# Patient Record
Sex: Female | Born: 1950
Health system: Southern US, Community
[De-identification: ages and names within clinical notes are randomized; demographics above are authoritative.]

## PROBLEM LIST (undated history)

## (undated) DIAGNOSIS — E785 Hyperlipidemia, unspecified: Secondary | ICD-10-CM

## (undated) DIAGNOSIS — H269 Unspecified cataract: Secondary | ICD-10-CM

## (undated) DIAGNOSIS — N809 Endometriosis, unspecified: Secondary | ICD-10-CM

## (undated) DIAGNOSIS — C50919 Malignant neoplasm of unspecified site of unspecified female breast: Secondary | ICD-10-CM

## (undated) DIAGNOSIS — G4700 Insomnia, unspecified: Secondary | ICD-10-CM

## (undated) DIAGNOSIS — M810 Age-related osteoporosis without current pathological fracture: Secondary | ICD-10-CM

## (undated) DIAGNOSIS — E041 Nontoxic single thyroid nodule: Secondary | ICD-10-CM

## (undated) DIAGNOSIS — E119 Type 2 diabetes mellitus without complications: Secondary | ICD-10-CM

## (undated) DIAGNOSIS — B351 Tinea unguium: Secondary | ICD-10-CM

## (undated) HISTORY — DX: Malignant neoplasm of unspecified site of unspecified female breast: C50.919

## (undated) HISTORY — DX: Nontoxic single thyroid nodule: E04.1

## (undated) HISTORY — DX: Unspecified cataract: H26.9

## (undated) HISTORY — DX: Endometriosis, unspecified: N80.9

## (undated) HISTORY — PX: OOPHORECTOMY: SHX86

## (undated) HISTORY — PX: BREAST SURGERY: SHX581

## (undated) HISTORY — DX: Insomnia, unspecified: G47.00

## (undated) HISTORY — DX: Type 2 diabetes mellitus without complications: E11.9

## (undated) HISTORY — DX: Hyperlipidemia, unspecified: E78.5

## (undated) HISTORY — DX: Age-related osteoporosis without current pathological fracture: M81.0

## (undated) HISTORY — PX: APPENDECTOMY: SHX54

## (undated) HISTORY — DX: Tinea unguium: B35.1

## (undated) HISTORY — PX: VAGINAL HYSTERECTOMY: SUR661

---

## 1997-11-21 ENCOUNTER — Other Ambulatory Visit: Admission: RE | Admit: 1997-11-21 | Discharge: 1997-11-21 | Payer: Self-pay | Admitting: Obstetrics and Gynecology

## 1998-12-04 ENCOUNTER — Ambulatory Visit (HOSPITAL_COMMUNITY): Admission: RE | Admit: 1998-12-04 | Discharge: 1998-12-04 | Payer: Self-pay | Admitting: Family Medicine

## 1998-12-11 ENCOUNTER — Other Ambulatory Visit: Admission: RE | Admit: 1998-12-11 | Discharge: 1998-12-11 | Payer: Self-pay | Admitting: Obstetrics and Gynecology

## 1999-12-17 ENCOUNTER — Other Ambulatory Visit: Admission: RE | Admit: 1999-12-17 | Discharge: 1999-12-17 | Payer: Self-pay | Admitting: Obstetrics and Gynecology

## 2000-03-23 ENCOUNTER — Ambulatory Visit (HOSPITAL_COMMUNITY): Admission: RE | Admit: 2000-03-23 | Discharge: 2000-03-23 | Payer: Self-pay | Admitting: Obstetrics and Gynecology

## 2000-03-23 ENCOUNTER — Encounter (INDEPENDENT_AMBULATORY_CARE_PROVIDER_SITE_OTHER): Payer: Self-pay

## 2000-11-15 ENCOUNTER — Encounter: Payer: Self-pay | Admitting: Family Medicine

## 2000-11-15 ENCOUNTER — Ambulatory Visit (HOSPITAL_COMMUNITY): Admission: RE | Admit: 2000-11-15 | Discharge: 2000-11-15 | Payer: Self-pay | Admitting: *Deleted

## 2001-01-01 ENCOUNTER — Other Ambulatory Visit: Admission: RE | Admit: 2001-01-01 | Discharge: 2001-01-01 | Payer: Self-pay | Admitting: Obstetrics and Gynecology

## 2001-05-31 ENCOUNTER — Inpatient Hospital Stay (HOSPITAL_COMMUNITY): Admission: RE | Admit: 2001-05-31 | Discharge: 2001-06-02 | Payer: Self-pay | Admitting: Obstetrics and Gynecology

## 2001-05-31 ENCOUNTER — Encounter (INDEPENDENT_AMBULATORY_CARE_PROVIDER_SITE_OTHER): Payer: Self-pay | Admitting: Specialist

## 2002-11-22 ENCOUNTER — Ambulatory Visit (HOSPITAL_COMMUNITY): Admission: RE | Admit: 2002-11-22 | Discharge: 2002-11-22 | Payer: Self-pay | Admitting: Pediatrics

## 2003-02-14 ENCOUNTER — Ambulatory Visit (HOSPITAL_COMMUNITY): Admission: RE | Admit: 2003-02-14 | Discharge: 2003-02-14 | Payer: Self-pay | Admitting: *Deleted

## 2004-11-05 ENCOUNTER — Ambulatory Visit (HOSPITAL_COMMUNITY): Admission: RE | Admit: 2004-11-05 | Discharge: 2004-11-05 | Payer: Self-pay | Admitting: Pediatrics

## 2004-12-03 ENCOUNTER — Encounter: Admission: RE | Admit: 2004-12-03 | Discharge: 2004-12-03 | Payer: Self-pay | Admitting: Obstetrics and Gynecology

## 2005-01-17 ENCOUNTER — Ambulatory Visit (HOSPITAL_BASED_OUTPATIENT_CLINIC_OR_DEPARTMENT_OTHER): Admission: RE | Admit: 2005-01-17 | Discharge: 2005-01-17 | Payer: Self-pay | Admitting: Surgery

## 2005-01-17 ENCOUNTER — Encounter (INDEPENDENT_AMBULATORY_CARE_PROVIDER_SITE_OTHER): Payer: Self-pay | Admitting: Surgery

## 2005-01-17 ENCOUNTER — Ambulatory Visit (HOSPITAL_COMMUNITY): Admission: RE | Admit: 2005-01-17 | Discharge: 2005-01-17 | Payer: Self-pay | Admitting: Surgery

## 2005-01-17 ENCOUNTER — Encounter (INDEPENDENT_AMBULATORY_CARE_PROVIDER_SITE_OTHER): Payer: Self-pay | Admitting: Specialist

## 2005-01-27 ENCOUNTER — Encounter (INDEPENDENT_AMBULATORY_CARE_PROVIDER_SITE_OTHER): Payer: Self-pay | Admitting: *Deleted

## 2005-01-27 ENCOUNTER — Ambulatory Visit (HOSPITAL_BASED_OUTPATIENT_CLINIC_OR_DEPARTMENT_OTHER): Admission: RE | Admit: 2005-01-27 | Discharge: 2005-01-27 | Payer: Self-pay | Admitting: Surgery

## 2005-01-27 ENCOUNTER — Ambulatory Visit (HOSPITAL_COMMUNITY): Admission: RE | Admit: 2005-01-27 | Discharge: 2005-01-27 | Payer: Self-pay | Admitting: Surgery

## 2005-01-31 ENCOUNTER — Ambulatory Visit: Payer: Self-pay | Admitting: Oncology

## 2005-02-02 ENCOUNTER — Ambulatory Visit: Admission: RE | Admit: 2005-02-02 | Discharge: 2005-03-02 | Payer: Self-pay | Admitting: Radiation Oncology

## 2005-03-23 ENCOUNTER — Ambulatory Visit (HOSPITAL_COMMUNITY): Admission: RE | Admit: 2005-03-23 | Discharge: 2005-03-23 | Payer: Self-pay | Admitting: Surgery

## 2005-03-23 ENCOUNTER — Ambulatory Visit (HOSPITAL_BASED_OUTPATIENT_CLINIC_OR_DEPARTMENT_OTHER): Admission: RE | Admit: 2005-03-23 | Discharge: 2005-03-24 | Payer: Self-pay | Admitting: Surgery

## 2005-03-23 ENCOUNTER — Encounter (INDEPENDENT_AMBULATORY_CARE_PROVIDER_SITE_OTHER): Payer: Self-pay | Admitting: Specialist

## 2006-05-15 IMAGING — US US SOFT TISSUE HEAD/NECK
1 series · 14 of 25 positions shown · non-contrast
Comparison: Prior study 11/22/02.

CLINICAL DATA: Follow-up thyroid nodule. 
 THYROID ULTRASOUND:

[Series 1: unknown · 0.09mm/px · 14 of 38 slices shown]
[im 1/38]
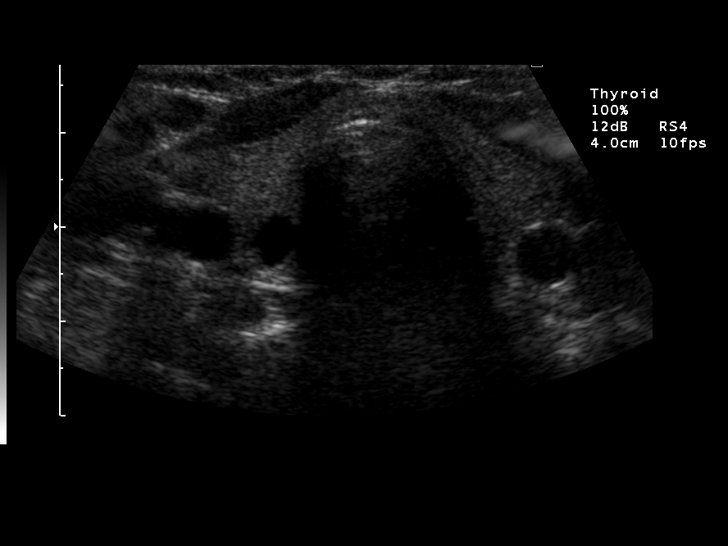
[im 4/38]
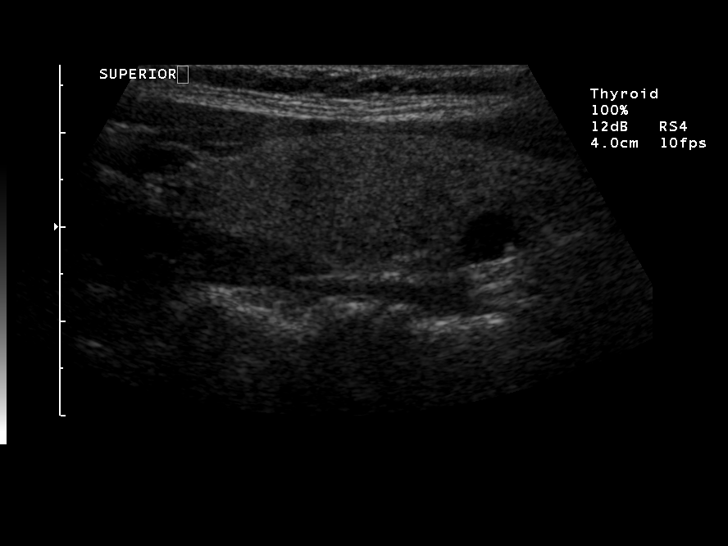
[im 7/38]
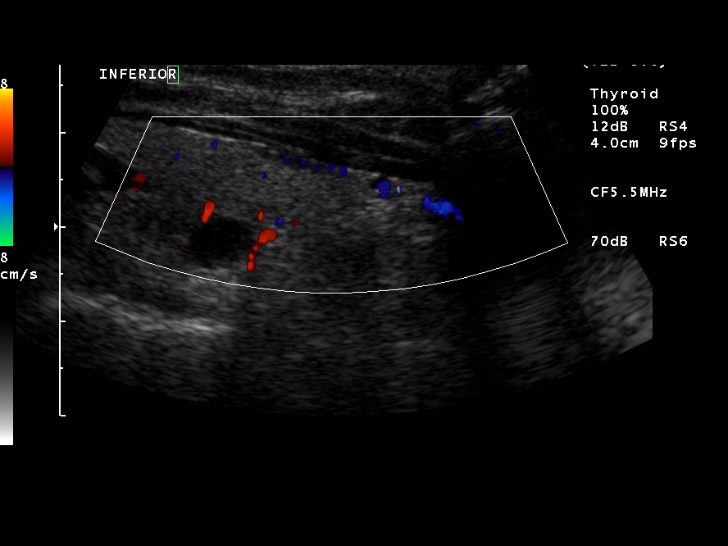
[im 10/38]
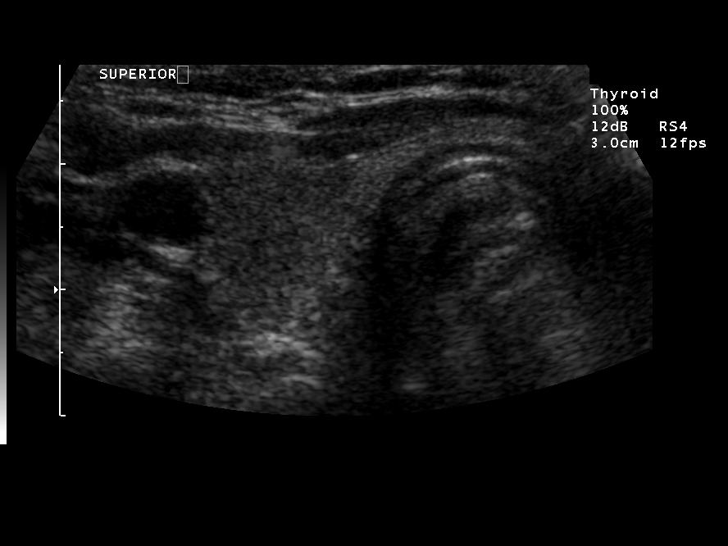
[im 13/38]
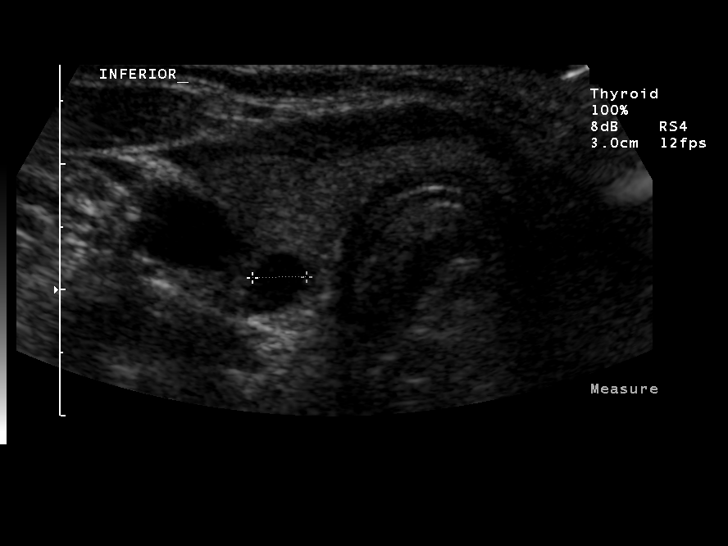
[im 14/38]
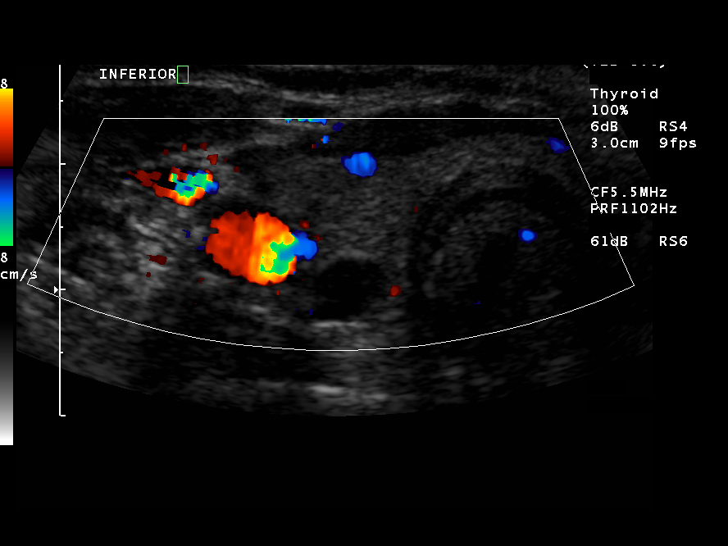
[im 17/38]
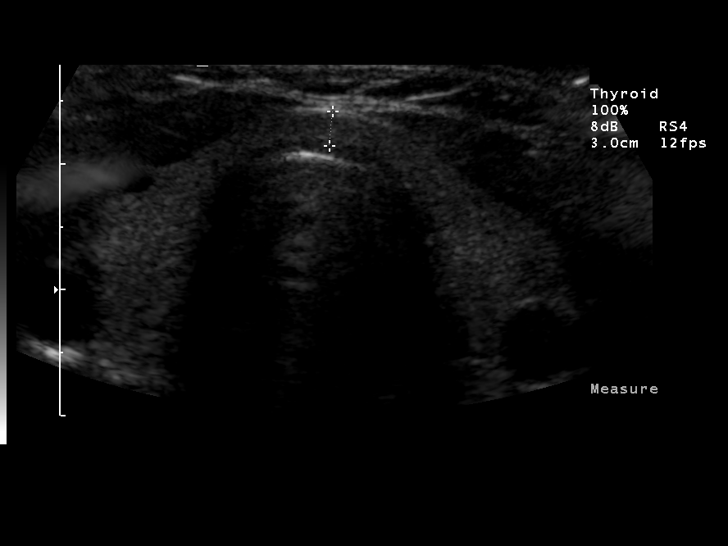
[im 21/38]
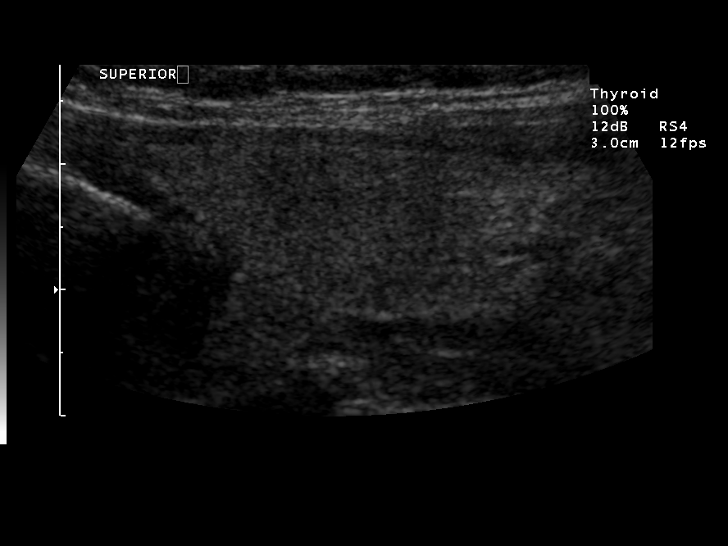
[im 24/38]
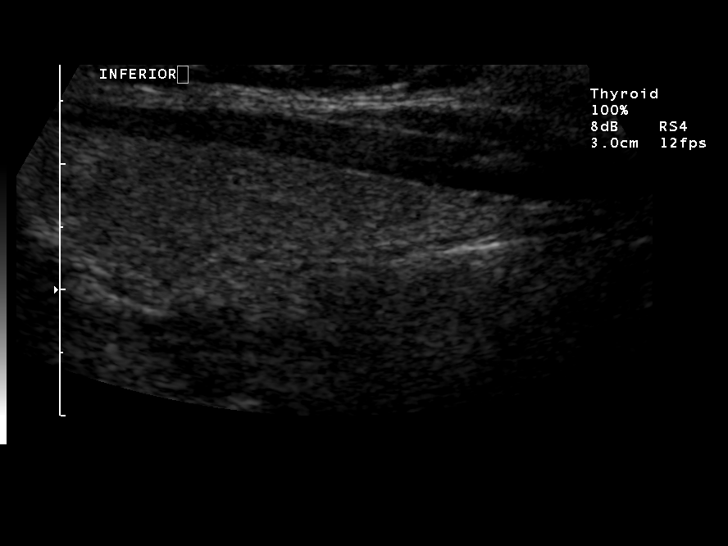
[im 25/38]
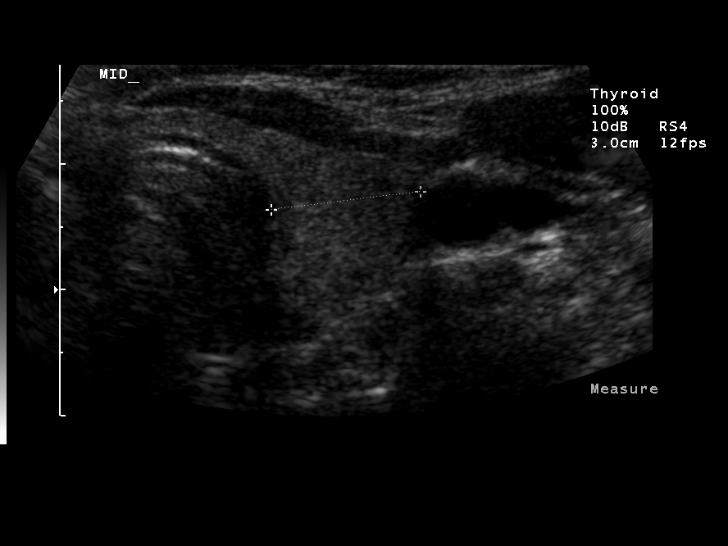
[im 28/38]
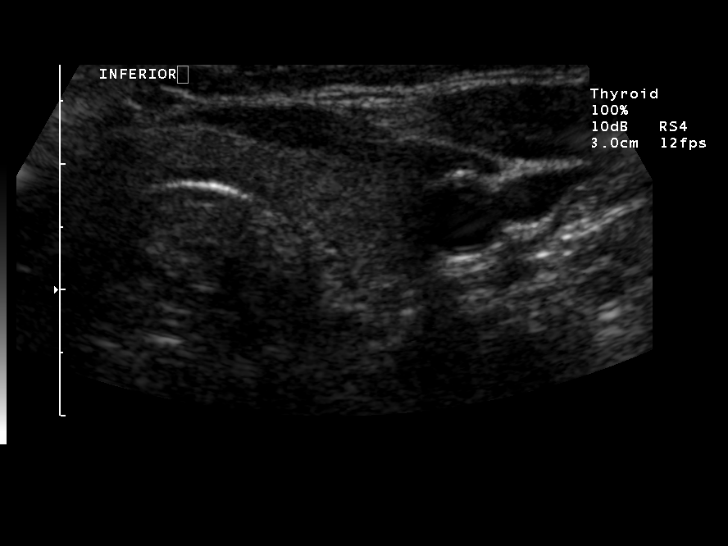
[im 31/38]
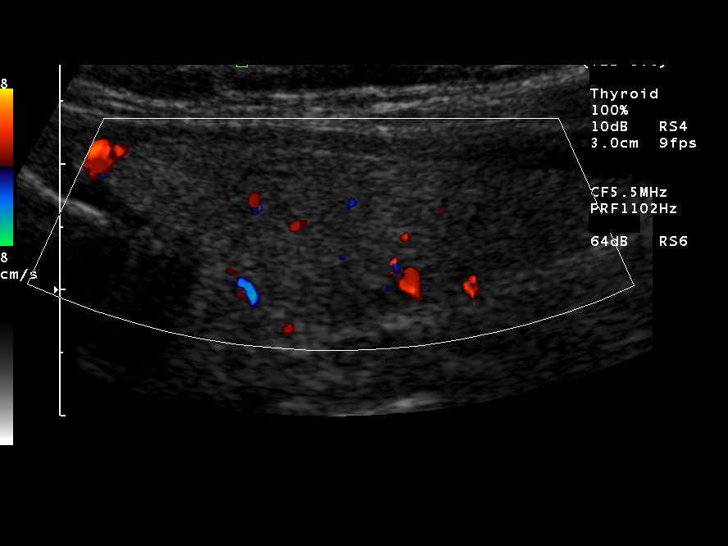
[im 34/38]
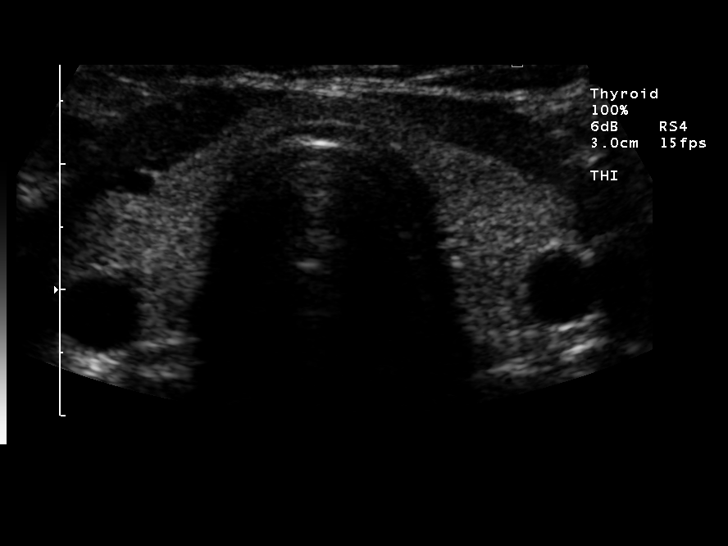
[im 38/38]
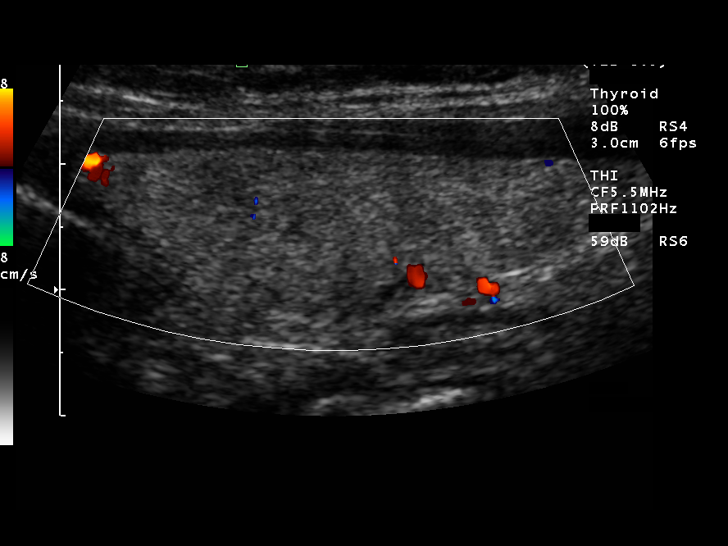

[14 of 25 positions shown; findings below may reference images not displayed]

The right lobe of the thyroid measures 4.3 X 1.5 X 1.4 cm and the left lobe 4.9 X 1.3 X 1.2 cm.  Posteriorly in the lower portion of the right lobe of the thyroid, there is a cyst that measures 8 X 5 X 4 mm.  There is a small echogenic area along the posterior-inferior margin of this cyst, but it otherwise appears simple and shows no abnormal blood flow.  This is only minimally larger than on the prior study on which it had a maximal diameter of 6 mm.  No solid thyroid nodules are demonstrated.
IMPRESSION: Little change is seen from the patient?s prior study.  A small cystic lesion inferiorly in the right lobe of the thyroid has a nonaggressive appearance and is probably a colloid cyst.

## 2006-10-13 ENCOUNTER — Ambulatory Visit (HOSPITAL_COMMUNITY): Admission: RE | Admit: 2006-10-13 | Discharge: 2006-10-13 | Payer: Self-pay

## 2008-02-07 ENCOUNTER — Ambulatory Visit (HOSPITAL_COMMUNITY): Admission: RE | Admit: 2008-02-07 | Discharge: 2008-02-07 | Payer: Self-pay | Admitting: *Deleted

## 2009-10-08 ENCOUNTER — Ambulatory Visit (HOSPITAL_COMMUNITY): Admission: RE | Admit: 2009-10-08 | Discharge: 2009-10-08 | Payer: Self-pay

## 2010-12-07 NOTE — Op Note (Signed)
[name], [name]              ACCOUNT NO.:  [id number]   MEDICAL RECORD NO.:  [id number]          PATIENT TYPE:  AMB   LOCATION:  ENDO                         FACILITY:  [facility]   PHYSICIAN:  Georgiana Spinner, M.D.    DATE OF BIRTH:  [birth year]   DATE OF PROCEDURE:  DATE OF DISCHARGE:                               OPERATIVE REPORT   PROCEDURE:  Colonoscopy.   INDICATIONS:  Colon cancer screening.   ANESTHESIA:  Fentanyl 100 mcg, Versed 10 mg.   PROCEDURE IN DETAIL:  With the patient mildly sedated, in the left  lateral decubitus position, the Pentax videoscopic pediatric colonoscope  was inserted in the rectum and passed under direct vision to the cecum,  identified by ileocecal valve and appendiceal orifice, both of which  were photographed.  From this point the colonoscope was slowly  withdrawn, taking circumferential views of colonic mucosa, stopping only  in the rectum which appeared normal on rectum and showed small  hemorrhoids on retroflexed view.  The endoscope was straightened and  withdrawn.  The patient's vital signs, pulse oximeter remained stable.  The patient tolerated procedure well without apparent complications.   FINDINGS:  Small internal hemorrhoids, otherwise an unremarkable exam.   PLAN:  Consider repeat examination in 5-10 years.           ______________________________  Georgiana Spinner, M.D.     GMO/MEDQ  D:  02/07/2008  T:  02/07/2008  Job:  [id number]   cc:   Konrad Felix  [address] [place]  [place] [address]   Malachi Pro. Ambrose Mantle, M.D.  Fax: [phone]

## 2010-12-10 NOTE — Op Note (Signed)
   NAME:  Christina, Hart                     ACCOUNT NO.:  [id number]   MEDICAL RECORD NO.:  [id number]                   PATIENT TYPE:  AMB   LOCATION:  ENDO                                 FACILITY:  [facility]   PHYSICIAN:  Georgiana Spinner, M.D.                 DATE OF BIRTH:  [birth year]   DATE OF PROCEDURE:  02/14/2003  DATE OF DISCHARGE:                                 OPERATIVE REPORT   PROCEDURE:  Colonoscopy.   INDICATIONS:  Colon cancer screening.   ANESTHESIA:  Demerol 80 mg, Versed 8.   DESCRIPTION OF PROCEDURE:  With the patient mildly sedated and in the left  lateral decubitus position, the Olympus videoscopic variable-stiffness  colonoscope was inserted in the rectum and passed under direct vision.  With  pressure applied to the abdomen, we reached the cecum, identified by crow's  foot of the cecum and the ileocecal valve and transillumination of the right  lower quadrant of the abdomen.  From this point the colonoscope was slowly  withdrawn, taking circumferential views of the entire colonic mucosa,  stopping only in the rectum which appeared normal on direct and retroflexed  view.  The endoscope was straightened and withdrawn.  The patient's vital  signs and pulse oximetry remained stable.  The patient tolerated the  procedure well without apparent complications.   FINDINGS:  This is a negative colonoscopic examination to the cecum.   PLAN:  Consider repeat examination possibly in 5-10 years.                                               Georgiana Spinner, M.D.    GMO/MEDQ  D:  02/14/2003  T:  02/14/2003  Job:  [id number]   cc:   Demetrios Loll. Ambrose Mantle, M.D.  [address]  [address]  [place]  [place] [address]  Fax: [phone]

## 2010-12-10 NOTE — H&P (Signed)
[facility]  Patient:    [name], [name] Visit Number: [id number] MRN: [id number]          Service Type: GYN Location: 4W 0477 01 Attending Physician:  Malon Kindle Dictated by:   Malachi Pro. Ambrose Mantle, M.D. Admit Date:  05/31/2001                           History and Physical  HISTORY OF PRESENT ILLNESS:  This is a 60 year old, white, married female, para 1-0-0-1, who was admitted to the hospital for abdominal hysterectomy and removal of her remaining ovary and both tubes because of recurrent and persistent abnormal uterine bleeding, leiomyomata uteri, and probable adenomyosis.  Last menstrual period May 20, 2001.  The patient has kept a log of her menstrual flow over the last year.  During the month of October, she had some bleeding all but three days.  During the month of September, she bled for over 10 days.  During the month of August, she had bleeding for at least 15 days.  She has been plaqued with abnormal uterine bleeding for some time.  In August of 2001, she underwent a D&C and hysteroscopy which did not show an intraluminal fibroids, but showed polyps and the tissue was benign. She has been treated over the last year with Provera on a monthly basis and in spite of that has continued to have abnormal uterine bleeding.  In the past, she had undergone oophorectomy for endometriosis of the right ovary and she subsequently had an enlarged uterus that was thought to be secondary to either fibroid alone or adenomyosis and fibroid.  At the present time, she is admitted to remove the uterus and the remaining tubes and ovary.  ALLERGIES:  No known drug allergies.  PAST SURGICAL HISTORY:  She did have a right oophorectomy in 1982.  She had a cesarean section in 1987.  She had removal of an endometrioma of the abdominal wall in 1989 and a D&C and hysteroscopy in 2001.  PAST MEDICAL HISTORY:  Illnesses:  She has had no major adult  illness.  SOCIAL HISTORY:  She does not drink or smoke.  REVIEW OF SYSTEMS:  She has a negative review of systems, except as in the present illness.  MEDICATIONS:  She takes Synthroid, Provera, and Achromycin.  FAMILY HISTORY:  Her mother died at 52 of cancer.  One sister is living and well.  One brother is living and well.  Her father died at 93 or 50 of heart disease.  PHYSICAL EXAMINATION:  A well-developed, well-nourished, white female in no acute distress.  VITAL SIGNS:  The blood pressure is 130/80 and the pulse is 80.  WEIGHT:  121 pounds.  HEENT:  No cranial abnormalities.  Extraocular movements are intact.  The nose and pharynx are clear.  NECK:  Supple without thyromegaly.  I do not truly feel a thyroid nodule at this time, although she has had a thyroid nodule in the past and has been suppressed with Synthroid.  BREASTS:  Soft without masses.  HEART:  Normal size and sounds.  No murmurs.  LUNGS:  Clear to P&A.  ABDOMEN:  Soft, flat, and nontender.  There is a healed lower abdominal transverse scar.  PELVIC:  The vulva and vagina are clear.  A Pap smear on January 01, 2001, was within normal limits.  The cervix is clean.  The uterus is anterior and upper limit of normal size.  The adnexa are free of masses.  RECTAL:  Exam in June was negative.  ADMITTING IMPRESSION: 1. Persistent abnormal uterine bleeding in spite of dilatation and curettage,    hysteroscopy, and multiple attempts at control with Provera. 2. Leiomyomata uteri. 3. Probable adenomyosis.  PLAN:  The patient is admitted for abdominal hysterectomy, removal of the left ovary, and both tubes.  She does understand that the risks of surgery include, but are not limited to heart attack, stroke, pulmonary embolus, wound disruption, thrombophlebitis, hemorrhage with need for reoperation and/or transfusion, fistula formation, nerve injury, and intestinal obstruction.  She understands and agrees to  proceed.  She has also been counseled about the indeterminate effect of hysterectomy and oophorectomy on sexual desire and function.  She understands and agrees to proceed with surgery. Dictated by:   Malachi Pro. Ambrose Mantle, M.D. Attending Physician:  Malon Kindle DD:  05/30/01 TD:  06/01/01 Job: [id number]

## 2010-12-10 NOTE — Discharge Summary (Signed)
[facility]  Patient:    [name], [name] Visit Number: [id number] MRN: [id number]          Service Type: GYN Location: 4W 0477 01 Attending Physician:  Malon Kindle Dictated by:   Malachi Pro. Ambrose Mantle, M.D. Admit Date:  05/31/2001 Discharge Date: 06/02/2001                             Discharge Summary  HISTORY OF PRESENT ILLNESS:  A 60 year old white female with persistent abnormal uterine bleeding, fibroids, probable adenomyosis, admitted for abdominal hysterectomy, left salpingo-oophorectomy, and right salpingectomy. At the time of surgery, the patient was found to have left ovarian endometriosis binding the left ovary to the pelvic wall, endometriosis binding the rectum to the posterior cervix, endometriosis of the broad ligaments, and a left paraovarian cyst.  HOSPITAL COURSE:  She underwent the lysis of adhesions, abdominal hysterectomy, left salpingo-oophorectomy, and right salpingectomy by Dr. Ambrose Mantle with Dr. Jackelyn Knife assisting under general anesthesia with blood loss about 350 cc.  Postoperatively, the patient did quite well, temperature maximum of 100.3 degrees.  Abdomen was soft and nontender.  She passed flatus spontaneously.  Incision looked like it was healing well.  She was ambulating well without difficulty, voiding well and, on the second postoperative day, was felt to be a candidate for discharge.  Urinalysis was negative. 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  CBC showed a white count of 5600, hemoglobin 14.2, hematocrit 40.7 with 59 segs, 30 lymphs, 7 monos, 3 eosinophils, and 1 basophil.  Follow-up hematocrit 35.8 and a subsequent hematocrit was stable.  Pathology report is not available.  FINAL DIAGNOSIS;  Leiomyomata uteri, probable adenomyosis, persistent abnormal uterine bleeding despite a dilation and curettage and repeated bouts  of hormonal therapy.  OPERATION:  Abdominal hysterectomy, left salpingo-oophorectomy, right salpingectomy, lysis of adhesions.  FINAL CONDITION:  Improved.  INSTRUCTIONS:  Regular discharge instructions.  No vaginal entrance.  No heavy lifting or strenuous activity.  Concentrate on a liquid diet until freely passing flatus and having bowel movements.  Report any vaginal bleeding of any significance.  Report any temperature elevation greater than 100.6 degrees. Percocet 5/325, #16 tablets, one or two every three to four hours as needed for pain.  Return to the office in three days for staple removal and follow-up. Dictated by:   Malachi Pro. Ambrose Mantle, M.D. Attending Physician:  Malon Kindle DD:  06/02/01 TD:  06/03/01 Job: [id number]

## 2010-12-10 NOTE — Op Note (Signed)
[name], [name]              ACCOUNT NO.:  [id number]   MEDICAL RECORD NO.:  [id number]          PATIENT TYPE:  AMB   LOCATION:  DSC                          FACILITY:  [facility]   PHYSICIAN:  Currie Paris, M.D.DATE OF BIRTH:  07/26/1950   DATE OF PROCEDURE:  03/23/2005  DATE OF DISCHARGE:                                 OPERATIVE REPORT   OFFICE MEDICAL RECORD NUMBER:  [id number].   PREOPERATIVE DIAGNOSIS:  DCIS a left breast with close margin on lumpectomy.   POSTOPERATIVE DIAGNOSIS:  DCIS a left breast with close margin on  lumpectomy.   OPERATION:  Left total mastectomy with blue dye injection and axillary  sentinel lymph node biopsy (one node).   SURGEON:  Currie Paris, M.D.   ASSISTANT:  Rose Phi. Maple Hudson, M.D.   ANESTHESIA:  General.   CLINICAL HISTORY:  [name] is a 60 year old lady who had an extensive DCIS  process involving the central area of her breast. She underwent a partial  mastectomy to include the nipple-areolar complex and her cranial margin was  very close. After lengthy discussion with the patient and consultations with  radiation therapist and other surgeons, she elected to proceed to a total  mastectomy and sentinel node biopsy. She at this point did not wish to  proceed to a reconstruction.   DESCRIPTION OF PROCEDURE:  The patient was seen in the holding are and she  had no further questions. Her left breast had already been injected by the  radiology department and marked by the patient. I confirmed the left breast  was the operative side and marked it as well.   The patient was taken to the operating room and after satisfactory general  anesthesia had been obtained, the left breast was prepped with some alcohol.  The time-out occurred. I injected about 5 mL of methylene blue subdermally.   The breast was then prepped and draped as a sterile field. I outlined an  elliptical incision. The patient had a hemangiomatous type of lesion  in the  lower inner quadrant that she wished to have removed along with this that we  were able to include in the incision. I then marked the axilla and the area  that was hot so we would have that identified.   The elliptical incision was then made and a skin flap raised medially to the  middle of the sternum and then superiorly to the clavicle and laterally out  into the axilla. Once we had the skin elevated over the area that was hot I  was able to divide into the axillary fat pad and almost immediately  identified hot blue node. This had counts of about 500. There was no other  palpable adenopathy and once this node was removed, we saw no other hot  areas.   Attention was turned back to the mastectomy and I made the anterior inferior  flap raising it to the inframammary fold and then laterally into the  latissimus dorsi.   The breast was then removed from medial to lateral taking the fascia. This  was all done  with cautery. Once we got to the axilla, the remaining breast  tissue was cleaned off and was disconnected without taking any further nodal  tissue out.   I put two 19 Blake drains in and secured them with a 2-0 nylon. I spent  several minutes irrigating and making sure everything was completely dry and  once everything did look dry and we waited several minutes, I went ahead and  closed the wound with staples. I then left 30 mL of Marcaine under the flaps  for approximately 10 minutes and then charged the drains.   Sterile dressings were applied. The patient tolerated procedure well. No  complications. All counts were correct.      Currie Paris, M.D.  Electronically Signed     CJS/MEDQ  D:  03/23/2005  T:  03/24/2005  Job:  [id number]   cc:   Malachi Pro. Ambrose Mantle, M.D.  [address]  [address]  [place] [address]  Fax: [phone]   Orpha Bur, M.D.  [address] [facility]  [place]  [place] [address]  Fax: [phone]

## 2010-12-10 NOTE — Op Note (Signed)
[facility]  Patient:    [name], [name] Visit Number: [id number] MRN: [id number]          Service Type: GYN Location: 4W 0477 01 Attending Physician:  Malon Kindle Dictated by:   Malachi Pro. Ambrose Mantle, M.D. Proc. Date: 05/31/01 Admit Date:  05/31/2001                             Operative Report  PREOPERATIVE DIAGNOSES: 1. Persistent abnormal uterine bleeding. 2. Fibroids. 3. Probable adenomyosis.  POSTOPERATIVE DIAGNOSES: 1. Persistent abnormal uterine bleeding. 2. Fibroids. 3. Probable adenomyosis. 4. Left ovarian endometriosis binding the left ovary to the left    pelvic wall. 5. Endometriosis binding the rectum to the posterior cervix. 6. Endometriosis of the broad ligaments. 7. Left paraovarian cyst.  OPERATION:  Abdominal hysterectomy, left salpingo-oophorectomy, right salpingectomy.  SURGEON:  Malachi Pro. Ambrose Mantle, M.D.  ASSISTANT:  Zenaida Niece, M.D.  ANESTHESIA:  General.  DESCRIPTION OF PROCEDURE:  The patient was brought to the operating room and placed under satisfactory general anesthesia, placed in the frogleg position. The abdomen, vulva, vagina and urethra were prepped with Betadine solution.  A Foley catheter was inserted to drain.  The patient was then placed supine. The abdomen was draped as a sterile field.  The old transverse incision that had been utilized for her three previous surgeries was used, and the incision was made through the scar down through the subcutaneous tissue and in through the fascia.  The fascia was then separated from the rectus muscle superiorly and inferiorly.  The rectus muscle was cut in the midline having been completely approximated.  The peritoneum was opened vertically.  Exploration of the upper abdomen revealed the liver to be smooth.  There was a question of maybe whether it was a little thick in its right lateral portion, but I asked Dr. Jackelyn Knife to examine.  He did not  feel any significant abnormality.  The gallbladder was smooth and cystic.  Both kidneys felt normal.  Exploration of the pelvis revealed the uterus to be upper limit of normal size.  The posterior cul-de-sac was completely obliterated because the rectum was adherent to the lower part of the cervix with endometriosis. The anterior cul-de-sac was normal.  The right tube was still present and was normal. However, there seemed to be some endometriosis along the right broad ligament. The left ovary was completely adherent to the left posterior broad ligament. There was a paraovarian cyst on the left approximately 5 cm that was quite thin.  The left tube was normal.  I used packs and retractors to repair the operative field, elevated the uterus into the incisional opening, cauterized both round ligaments and developed a bladder flap.  I doubly suture ligated the right infundibulopelvic ligament and also the left infundibulopelvic ligament after I removed the  left ovary from its attachments to the left posterior broad ligament.  The tissue along both broad ligaments was somewhat indurated from the endometriosis.  I took great care to make sure that I stayed well away from the rectum.  After I reached almost the region where the rectum was I made some sharp dissection to push the rectum away from the posterior cervix.  Then I was able to clamp, cut and suture ligate the paracervical tissues and enter the right vaginal angle.  I then removed the uterus, left tube and ovary and right tube by transecting the upper vagina. Vaginal  angle sutures were placed.  The central portion of the cuff was closed with interrupted figure-of-eight sutures of 0 Vicryl.  Hemostasis was somewhat difficult to achieve but after we worked we did achieve hemostasis.  I then filled the bladder with methylene blue stained fluid and there was no leakage. There were no sutures close to the bladder.  I then asked Dr. Jackelyn Knife  to do a rectal exam.  He was able to do a rectal exam and reach the area where the rectum had been adherent and there did not seem to be any injury to the rectum.  I then partially reperitonealized over the vaginal cuff, liberally irrigated to confirm hemostasis, removed all packs and retractors, placed the omentum over the vaginal cuff and then closed the abdominal wall using a running suture of 0 Vicryl on the peritoneum and two running sutures of 0 Vicryl on the fascia, a running 3-0 Vicryl in the subcutaneous tissue and staples on the skin.  The patient seemed to tolerate the procedure well. Blood loss was about 350 cc.  Sponge and needle counts were correct, and she was returned to recovery in satisfactory condition. Dictated by:   Malachi Pro. Ambrose Mantle, M.D. Attending Physician:  Malon Kindle DD:  05/31/01 TD:  06/02/01 Job: [id number]

## 2010-12-10 NOTE — Op Note (Signed)
NAMEKATELINE, [name]              ACCOUNT NO.:  [id number]   MEDICAL RECORD NO.:  [id number]          PATIENT TYPE:  AMB   LOCATION:  DSC                          FACILITY:  [facility]   PHYSICIAN:  Currie Paris, M.D.DATE OF BIRTH:  Mar 12, 1951   DATE OF PROCEDURE:  01/27/2005  DATE OF DISCHARGE:                                 OPERATIVE REPORT   CCS [phone].   PREOPERATIVE DIAGNOSIS:  Carcinoma (DCIS) left breast (central).   POSTOPERATIVE DIAGNOSIS:  Carcinoma (DCIS) left breast (central).   OPERATION:  Left partial mastectomy.   SURGEON:  Dr. Jamey Ripa.   ANESTHESIA:  General.   CLINICAL HISTORY:  This a 54-year lady had an abnormal mammogram and MRI  worrisome for some DCIS similarly.  Unfortunately, a core biopsy was  nondiagnostic so an excisional biopsy was performed which showed DCIS with a  fair amount around the margins since we were very close to the nipple-  areolar complex and did not take that out. After discussion of alternatives  with the patient, she elected to go ahead and have partial mastectomy to  include the nipple-areolar complex to see if we could treat her as a breast  conserving surgery.   DESCRIPTION OF PROCEDURE:  The patient was seen in the holding area and had  no further questions.  The left breast was marked by the patient and myself  as the operative side.   She is taken to have room and after satisfactory general anesthesia  obtained, the breast was prepped and draped. Time-out occurred.   I made a transverse elliptical incision to include the nipple-areolar  complex and going a little in either direction. Using the cautery, I raised  a flap superiorly and then went down through fatty tissue, breast tissue  down to the chest wall staying about 3-4 cm above the nipple itself. I then  worked around medially and then inferiorly, I raised a flap and again then  went down to the chest wall and then finally detached the remaining  attachments  laterally. This produced a complete central excision of the  breast including skin, breast tissue down to chest wall.   The bleeders controlled with cautery. The area was checked for hemostasis  and appeared dry. I injected Marcaine help with postop pain relief and then  closed the superficial layer with some of 3-0 Vicryl and 4-0 Monocryl,  leaving a cavity that I might potentially be a MammoSite cavity.   The patient tolerated procedure well. There were no complications. All  counts were correct.   Dr. Clelia Croft reported that the by Touch Preps, the margins appeared negative.       CJS/MEDQ  D:  01/27/2005  T:  01/27/2005  Job:  [id number]   cc:   Demetrios Loll. Ambrose Mantle, M.D.  [address]  [address]  [place]  [place] [address]  Fax: [phone]

## 2010-12-10 NOTE — Op Note (Signed)
[facility]  Patient:    [name], [name]                  MRN: [id number] Proc. Date: 03/23/00 Adm. Date:  [id number] Attending:  Malon Kindle                           Operative Report  PREOPERATIVE DIAGNOSIS:  Abnormal uterine bleeding, fibroids.  POSTOPERATIVE DIAGNOSIS:  Abnormal uterine bleeding, fibroids with endometrial polyp.  OPERATION:  D&C, hysteroscopic removal of endometrial polyp.  SURGEON:  Malachi Pro. Ambrose Mantle, M.D.  ANESTHESIA:  General.  DESCRIPTION OF PROCEDURE:  The patient was brought to the operating room and placed under satisfactory general anesthesia.  She was placed in the lithotomy position.  The vulva, vagina, and perineum were prepped with Betadine solution and draped as a sterile field.  Exam revealed the uterus to be anterior. There was a small fibroid on the anterior surface of the uterus, about 2 cm. The adnexa were free of masses.  The cervix was sounded to 3.5 to 4 inches anteriorly, and dilated to a 31 Pratt dilator.  The hysteroscope was inserted and went all the way to the fundus.  There was a polyp, sort of sessile-like polyp at the anterior fundus which was removed with the biopsy forceps in three or four swipes.  Additional tissue was taken at other parts of the endometrium, although I did not see any other true polyps.  I then did a fractional D&C, removing the tissue from the endocervix and the endometrial cavity using the polyp forceps at both places, as well as a curet.  Looked again with the hysteroscope to ensure that there was no significant tissue left, and the procedure was terminated.  There were no intraluminal fibroids seen.  There were no other polyps seen.  Both tubal ostia areas were seen, but I did not actually see the tube leading the uterus.  The endocervical canal looked normal.  The procedure was terminated.  Blood loss less than 5 cc.  The patient was returned to recovery in  satisfactory condition. DD:  03/23/00 TD:  03/24/00 Job: [id number]

## 2010-12-10 NOTE — Op Note (Signed)
NAMEISLEY, WEISHEIT              ACCOUNT NO.:  [id number]   MEDICAL RECORD NO.:  [id number]          PATIENT TYPE:  AMB   LOCATION:  DSC                          FACILITY:  [facility]   PHYSICIAN:  Currie Paris, M.D.DATE OF BIRTH:  [birth year]   DATE OF PROCEDURE:  01/17/2005  DATE OF DISCHARGE:                                 OPERATIVE REPORT   CCS [id number].   PREOPERATIVE DIAGNOSIS:  Abnormal calcifications left breast.   POSTOPERATIVE DIAGNOSIS:  Abnormal calcifications left breast.   OPERATION:  Needle guided excision, left breast calcification.   SURGEON:  Dr. Jamey Ripa.   ANESTHESIA:  General.   CLINICAL HISTORY:  This is a 54-year lady who has some calcifications in the  retroareolar area of the left breast. She had a prior core biopsy which  showed some nodular adenosis but her mammogram was much more worrisome so we  elected to do a larger excisional biopsy.   DESCRIPTION OF PROCEDURE:  The patient was seen in the holding area and had  no further questions. The left breast was identified and marked by the  patient and myself as the operative side. Dr. Yolanda Bonine had already placed  the guidewire in the left breast and I reviewed the films and the guidewire  placed appropriately.   The patient was taken to the operating room and after satisfactory general  anesthesia had been obtained, the breast was prepped and draped. A time-out  occurred.   Guidewire entered in the approximately 12 o'clock position and tracked  inferiorly and just slightly laterally. Since calcifications appeared to be  somewhat subareolar, I made a circumareolar incision from about the 11  o'clock to about the 3:30 or 4 o'clock position.  I divided the subcutaneous  tissue and then elevated a little skin flap so I could get the guidewire  manipulated into the wound.  I did a wide excisional biopsy around this with  some very abnormal looking tissue subareolarly noted which was basically  going up  onto the nipple itself. I excised this and we got completely around  the guidewire with what I thought were good margins in all directions with  the exception of if there is any cancer found, the likely positive margin  would be the subcu areolar margin.   This was sent to specimen mammography. While waiting for that to come back,  I infiltrated some Marcaine help with postop analgesia, made sure everything  was dry, and then closed in layers with 3-0 Vicryl and for Monocryl  subcuticular and subsequently Dermabond.   Dr. Yolanda Bonine reported that the calcifications appeared to be in the tissue.  This concluded the procedure. The patient tolerated procedure well.  There  were no operative complications. All counts correct.       CJS/MEDQ  D:  01/17/2005  T:  01/17/2005  Job:  [id number]   cc:   Demetrios Loll. Ambrose Mantle, M.D.  [address]  [address]  [place]  [place] [address]  Fax: [phone]

## 2011-04-29 ENCOUNTER — Telehealth (INDEPENDENT_AMBULATORY_CARE_PROVIDER_SITE_OTHER): Payer: Self-pay

## 2011-04-29 NOTE — Telephone Encounter (Signed)
Pt called and requests to speak to Dr Jamey Ripa this afternoon if he can call her.  She states he did 3 breast cancer surgeries on her in 2006.  She has questions re: her follow up imaging.  Dr Yolanda Bonine says she needs a BSGI every 2 to 3 years.  Dr Ambrose Mantle told her he doesn't think she needs that but maybe a MRI which he would sign for the order.

## 2011-04-29 NOTE — Telephone Encounter (Signed)
The patient called today because she had some questions about whether she needed an MRI or a  BSGI testTo followup her breast cancer. She had a mastectomy about six years ago for DCIS. When she had her recent mammogram it was suggested that she might benefit from a BSGI but her gynecologist was a little reluctant order that. He also suggested that an MRI might be more beneficial but wasn't sure that it her were needed.  I went over all of this with her. I told her that my belief is that the MRI and theBSGI are about equivalent and accuracy for detecting a breast cancer. However, there is no evidence that either  is necessary for long-term followup of breast cancer patients.  I told her that aggressive screening followup might include either of these tests, but in her situation I did not think they were absolutely indicated. I told her she had any further questions to follow up with me. I told her I be happy to talk to her again even though he is no longer following up in my office for her long-term breast cancer followup

## 2011-04-30 ENCOUNTER — Other Ambulatory Visit (INDEPENDENT_AMBULATORY_CARE_PROVIDER_SITE_OTHER): Payer: Self-pay | Admitting: Surgery

## 2013-11-04 ENCOUNTER — Other Ambulatory Visit (HOSPITAL_COMMUNITY): Payer: Self-pay | Admitting: Family Medicine

## 2013-11-04 DIAGNOSIS — E041 Nontoxic single thyroid nodule: Secondary | ICD-10-CM

## 2013-11-08 ENCOUNTER — Ambulatory Visit (HOSPITAL_COMMUNITY)
Admission: RE | Admit: 2013-11-08 | Discharge: 2013-11-08 | Disposition: A | Discharge status: Home / Self Care | Payer: PRIVATE HEALTH INSURANCE | Source: Ambulatory Visit | Attending: Family Medicine | Admitting: Family Medicine

## 2013-11-08 DIAGNOSIS — E042 Nontoxic multinodular goiter: Secondary | ICD-10-CM | POA: Insufficient documentation

## 2013-11-08 DIAGNOSIS — E041 Nontoxic single thyroid nodule: Secondary | ICD-10-CM

## 2014-05-12 ENCOUNTER — Other Ambulatory Visit (HOSPITAL_COMMUNITY): Payer: Self-pay | Admitting: Family Medicine

## 2014-05-12 DIAGNOSIS — E042 Nontoxic multinodular goiter: Secondary | ICD-10-CM

## 2014-05-16 ENCOUNTER — Ambulatory Visit (HOSPITAL_COMMUNITY)
Admission: RE | Admit: 2014-05-16 | Discharge: 2014-05-16 | Disposition: A | Discharge status: Home / Self Care | Payer: PRIVATE HEALTH INSURANCE | Source: Ambulatory Visit | Attending: Family Medicine | Admitting: Family Medicine

## 2014-05-16 DIAGNOSIS — E041 Nontoxic single thyroid nodule: Secondary | ICD-10-CM | POA: Diagnosis present

## 2014-05-16 DIAGNOSIS — E042 Nontoxic multinodular goiter: Secondary | ICD-10-CM | POA: Insufficient documentation

## 2014-05-22 ENCOUNTER — Other Ambulatory Visit: Payer: Self-pay | Admitting: Family Medicine

## 2014-05-22 DIAGNOSIS — E041 Nontoxic single thyroid nodule: Secondary | ICD-10-CM

## 2014-06-03 ENCOUNTER — Other Ambulatory Visit (HOSPITAL_COMMUNITY)
Admission: RE | Admit: 2014-06-03 | Discharge: 2014-06-03 | Disposition: A | Discharge status: Home / Self Care | Payer: PRIVATE HEALTH INSURANCE | Source: Ambulatory Visit | Attending: Interventional Radiology | Admitting: Interventional Radiology

## 2014-06-03 ENCOUNTER — Ambulatory Visit
Admission: RE | Admit: 2014-06-03 | Discharge: 2014-06-03 | Disposition: A | Discharge status: Home / Self Care | Payer: PRIVATE HEALTH INSURANCE | Source: Ambulatory Visit | Attending: Family Medicine | Admitting: Family Medicine

## 2014-06-03 DIAGNOSIS — E041 Nontoxic single thyroid nodule: Secondary | ICD-10-CM

## 2015-05-12 ENCOUNTER — Other Ambulatory Visit (HOSPITAL_COMMUNITY): Payer: Self-pay | Admitting: Family Medicine

## 2015-05-12 DIAGNOSIS — E042 Nontoxic multinodular goiter: Secondary | ICD-10-CM

## 2015-05-21 ENCOUNTER — Other Ambulatory Visit (HOSPITAL_COMMUNITY): Payer: PRIVATE HEALTH INSURANCE

## 2015-05-22 ENCOUNTER — Ambulatory Visit (HOSPITAL_COMMUNITY): Payer: PRIVATE HEALTH INSURANCE

## 2015-05-25 ENCOUNTER — Ambulatory Visit (HOSPITAL_COMMUNITY)
Admission: RE | Admit: 2015-05-25 | Discharge: 2015-05-25 | Disposition: A | Discharge status: Home / Self Care | Payer: PRIVATE HEALTH INSURANCE | Source: Ambulatory Visit | Attending: Family Medicine | Admitting: Family Medicine

## 2015-05-25 DIAGNOSIS — E042 Nontoxic multinodular goiter: Secondary | ICD-10-CM

## 2015-05-25 DIAGNOSIS — E049 Nontoxic goiter, unspecified: Secondary | ICD-10-CM | POA: Diagnosis present

## 2015-06-25 HISTORY — PX: COLONOSCOPY: SHX174

## 2015-11-19 DIAGNOSIS — N182 Chronic kidney disease, stage 2 (mild): Secondary | ICD-10-CM

## 2015-11-19 HISTORY — DX: Chronic kidney disease, stage 2 (mild): N18.2

## 2015-11-23 IMAGING — US US SOFT TISSUE HEAD/NECK
1 series · 13 of 25 positions shown · non-contrast
Comparison: None.

CLINICAL DATA: 63-year-old female with multiple thyroid nodules.
Scheduled 6 month follow-up for new right and enlarging isthmic
thyroid nodules seen on prior thyroid ultrasound dated 11/08/2013.

EXAM:
THYROID ULTRASOUND
TECHNIQUE: Ultrasound examination of the thyroid gland and adjacent soft
tissues was performed.

[Series 1: us soft tissue head/neck · 0.06mm/px · 60 acquisitions, 13 frames shown]
[im 1/60]
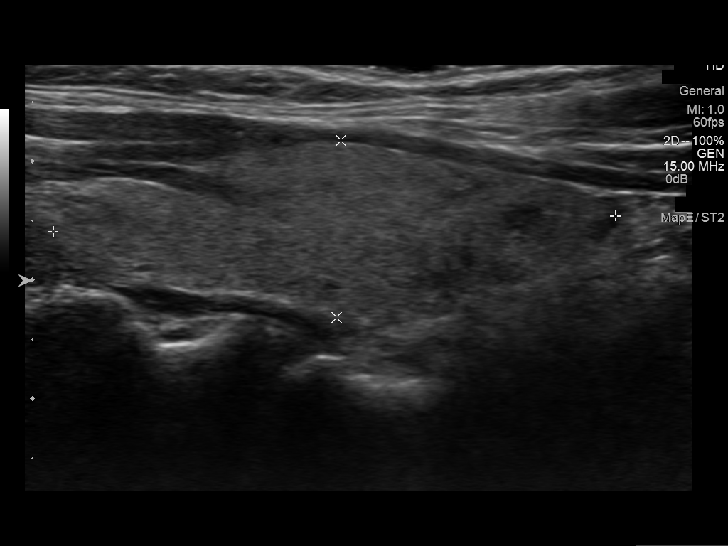
[im 5/60]
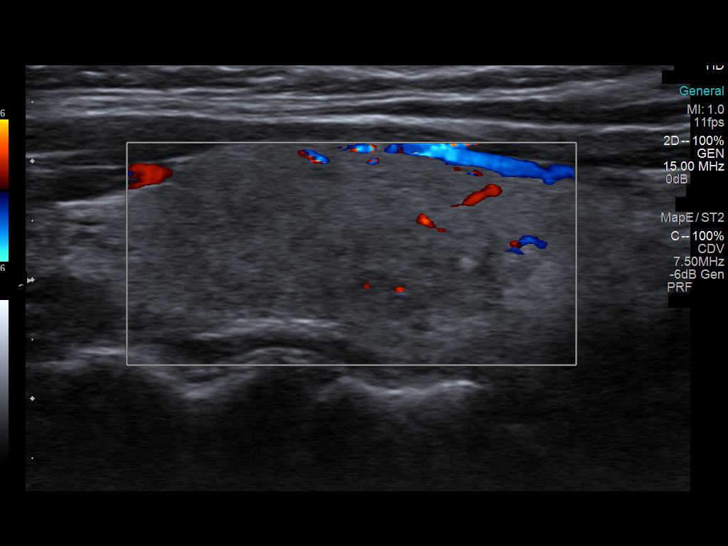
[im 10/60]
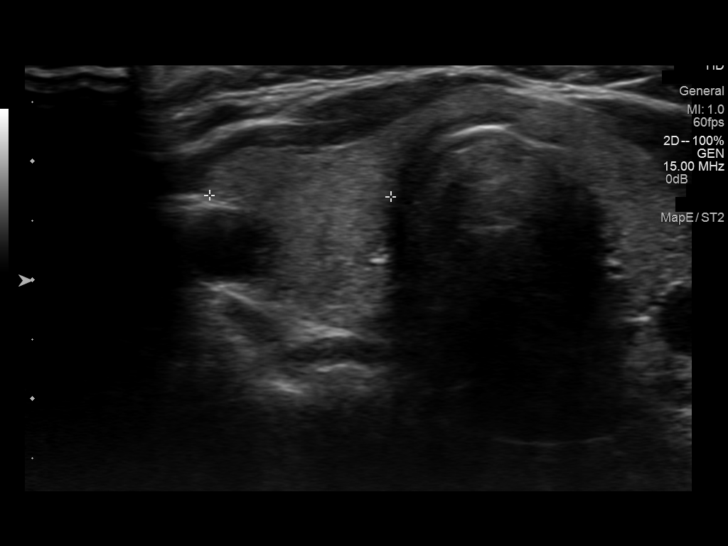
[im 15/60]
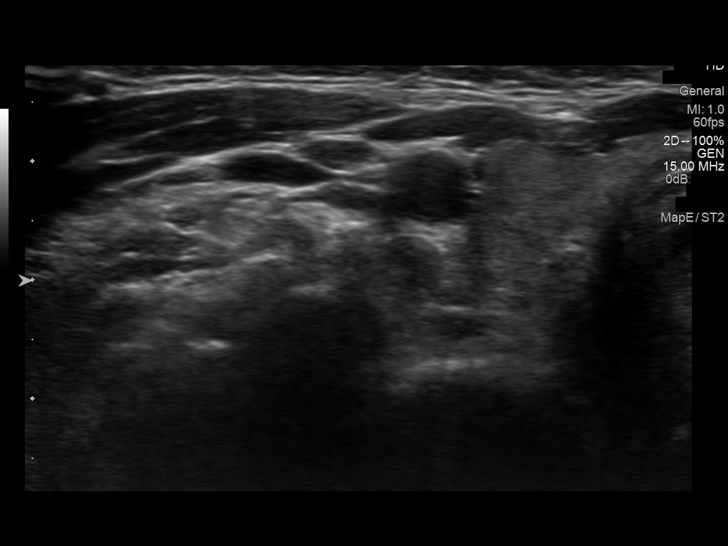
[im 20/60]
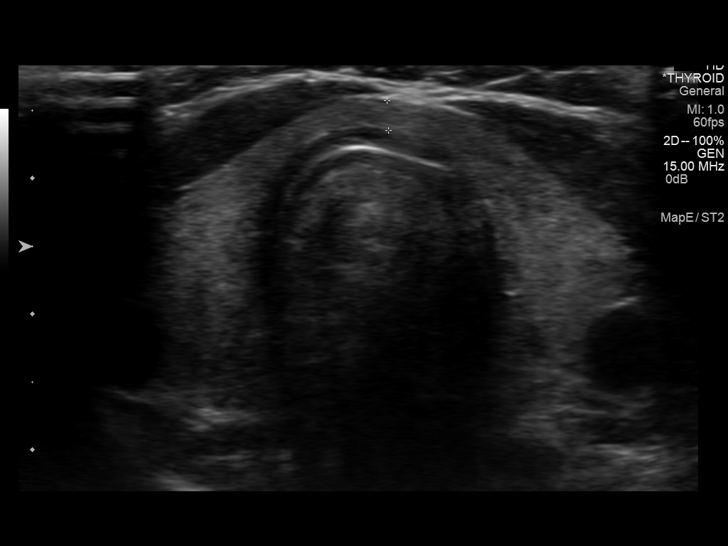
[im 25/60]
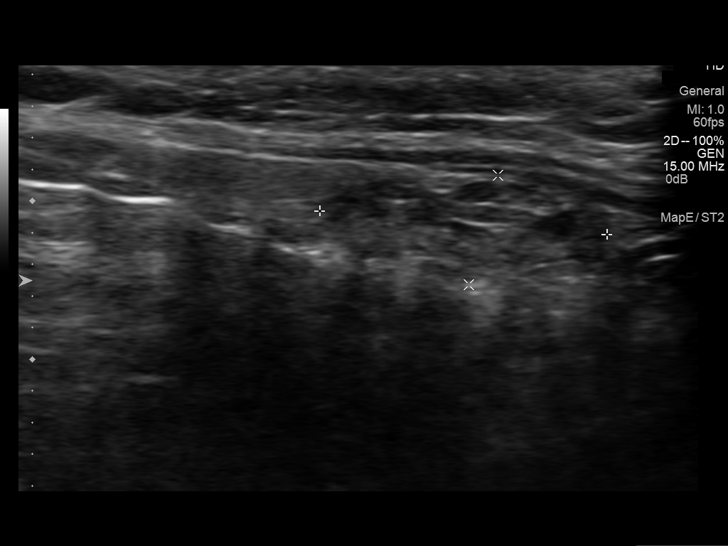
[im 30/60]
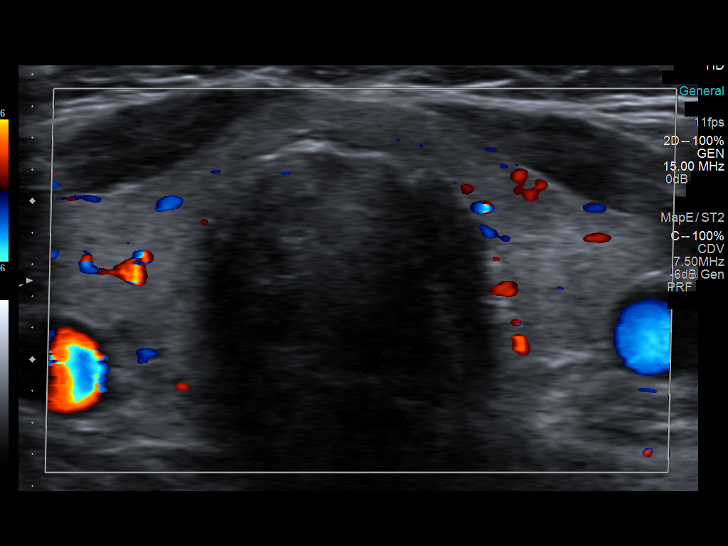
[im 35/60]
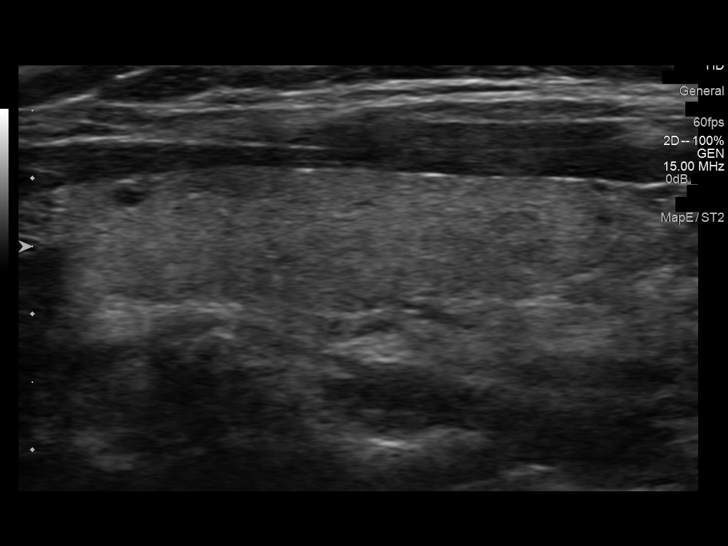
[im 40/60]
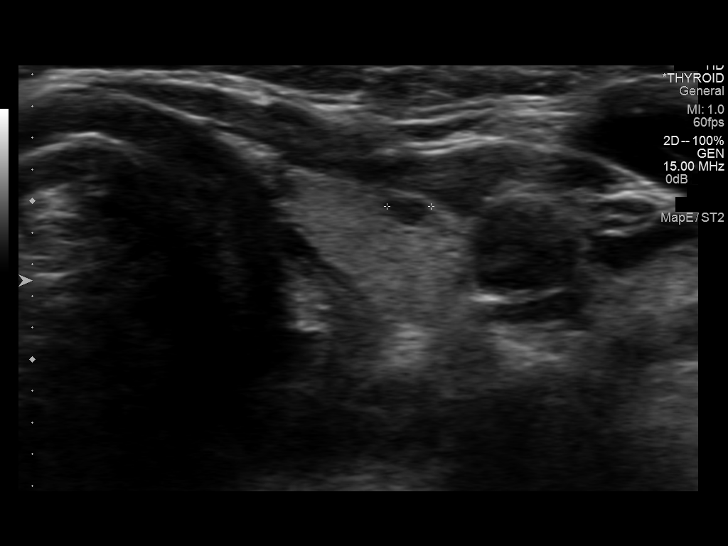
[im 45/60]
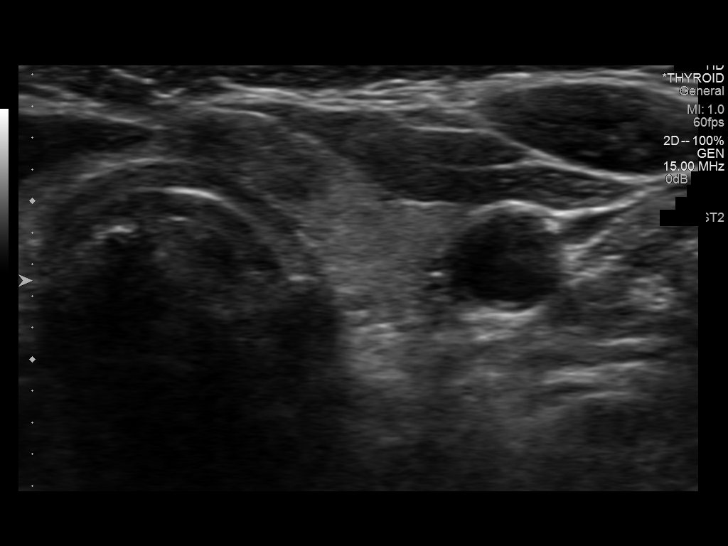
[im 50/60]
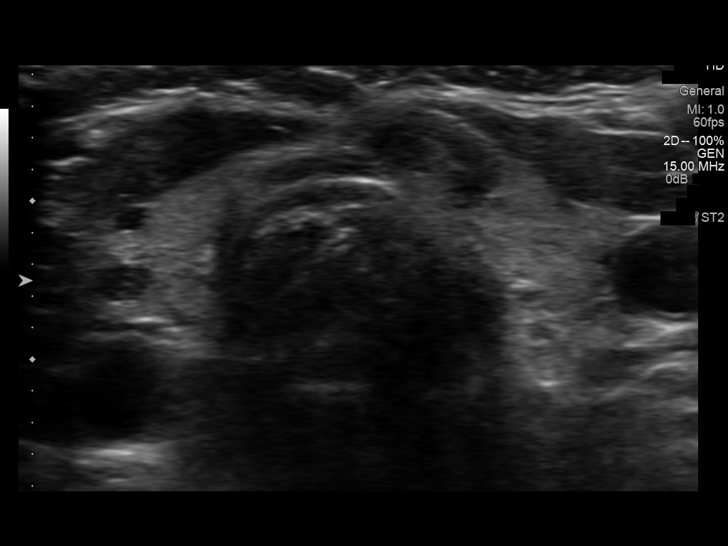
[im 55/60]
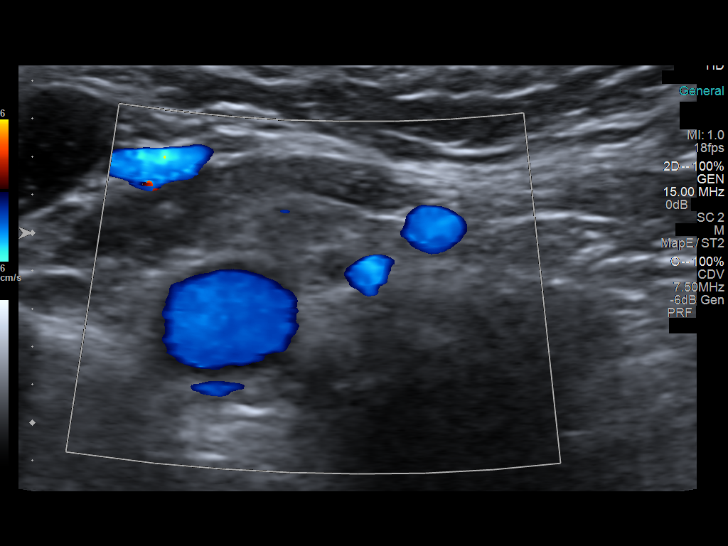
[im 60/60]
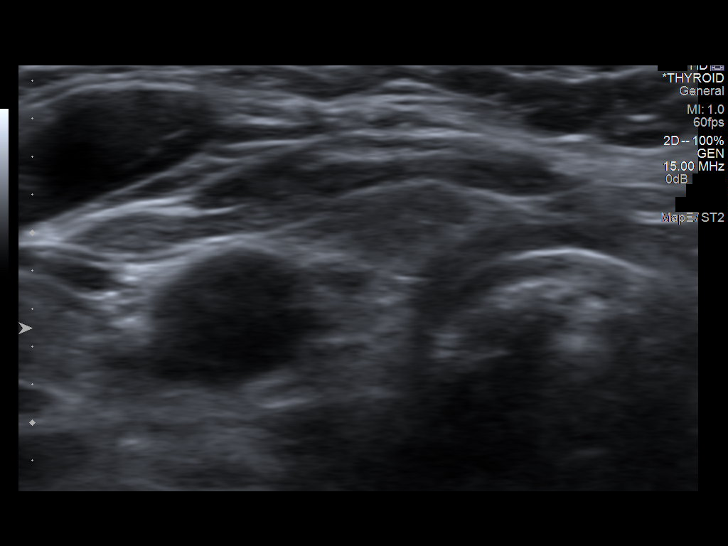

[13 of 25 positions shown; findings below may reference images not displayed]

FINDINGS: Right thyroid lobe

Measurements: 4.7 x 1.5 x 1.5 cm. Previously noted 1.4 cm nodule at
the inferior pole of the right thyroid gland is not well seen on the
present study and may have resolved. There is a small 4 mm
hypoechoic nodule which is unchanged compared to prior.

Left thyroid lobe

Measurements: 4.9 x 1.2 x 1.2 cm.  No nodules visualized.

Isthmus

Thickness: 0.2 cm. Slight interval enlargement of solid
heterogeneous nodule in the left aspect of the isthmus abutting the
mid thyroid gland. The nodule measures 1.8 x 0.7 x 1.3 cm today
compared to 1.3 x 0.7 x 1.3 cm previously. Additional tiny 3 mm
hypoechoic nodule at the superior pole.

Lymphadenopathy

None visualized.
IMPRESSION: 1. Slight interval enlargement of isthmic thyroid nodule which now
measures up to 1.8 cm compared to 1.3 cm previously. Part of the
interval enlargement may be related to differences in technique.
However, the nodule as measured today does meet size criteria for
biopsy. Ultrasound-guided fine needle aspiration should be
considered, as per the consensus statement: Management of Thyroid
Nodules Detected at US: Society of Radiologists in Ultrasound
2. The previously identified nodule in the inferior aspect of the
right lower lobe is not seen on today's evaluation and may have
involuted or represented a region of focal thyroiditis which is now
resolved .

## 2016-05-11 ENCOUNTER — Other Ambulatory Visit (HOSPITAL_COMMUNITY): Payer: Self-pay | Admitting: Family Medicine

## 2016-05-11 DIAGNOSIS — E042 Nontoxic multinodular goiter: Secondary | ICD-10-CM

## 2016-05-27 ENCOUNTER — Ambulatory Visit (HOSPITAL_COMMUNITY)
Admission: RE | Admit: 2016-05-27 | Discharge: 2016-05-27 | Disposition: A | Discharge status: Home / Self Care | Payer: BLUE CROSS/BLUE SHIELD | Source: Ambulatory Visit | Attending: Family Medicine | Admitting: Family Medicine

## 2016-05-27 ENCOUNTER — Ambulatory Visit (HOSPITAL_COMMUNITY): Payer: BLUE CROSS/BLUE SHIELD

## 2016-05-27 DIAGNOSIS — E042 Nontoxic multinodular goiter: Secondary | ICD-10-CM

## 2017-10-31 ENCOUNTER — Other Ambulatory Visit (HOSPITAL_COMMUNITY): Payer: Self-pay | Admitting: Family Medicine

## 2017-10-31 DIAGNOSIS — E039 Hypothyroidism, unspecified: Secondary | ICD-10-CM

## 2017-11-03 ENCOUNTER — Ambulatory Visit (HOSPITAL_COMMUNITY)
Admission: RE | Admit: 2017-11-03 | Discharge: 2017-11-03 | Disposition: A | Discharge status: Home / Self Care | Payer: BLUE CROSS/BLUE SHIELD | Source: Ambulatory Visit | Attending: Family Medicine | Admitting: Family Medicine

## 2017-11-03 DIAGNOSIS — E039 Hypothyroidism, unspecified: Secondary | ICD-10-CM | POA: Insufficient documentation

## 2017-11-03 DIAGNOSIS — E042 Nontoxic multinodular goiter: Secondary | ICD-10-CM | POA: Insufficient documentation

## 2018-01-02 DIAGNOSIS — Z23 Encounter for immunization: Secondary | ICD-10-CM | POA: Diagnosis not present

## 2018-02-23 DIAGNOSIS — Z01419 Encounter for gynecological examination (general) (routine) without abnormal findings: Secondary | ICD-10-CM | POA: Diagnosis not present

## 2018-02-23 DIAGNOSIS — Z1389 Encounter for screening for other disorder: Secondary | ICD-10-CM | POA: Diagnosis not present

## 2018-02-23 DIAGNOSIS — Z130 Encounter for screening for diseases of the blood and blood-forming organs and certain disorders involving the immune mechanism: Secondary | ICD-10-CM | POA: Diagnosis not present

## 2018-05-02 DIAGNOSIS — Z23 Encounter for immunization: Secondary | ICD-10-CM | POA: Diagnosis not present

## 2018-05-02 DIAGNOSIS — E1122 Type 2 diabetes mellitus with diabetic chronic kidney disease: Secondary | ICD-10-CM | POA: Diagnosis not present

## 2018-05-02 DIAGNOSIS — E782 Mixed hyperlipidemia: Secondary | ICD-10-CM | POA: Diagnosis not present

## 2018-05-02 DIAGNOSIS — E042 Nontoxic multinodular goiter: Secondary | ICD-10-CM | POA: Diagnosis not present

## 2018-05-02 DIAGNOSIS — E039 Hypothyroidism, unspecified: Secondary | ICD-10-CM | POA: Diagnosis not present

## 2018-05-02 DIAGNOSIS — Z9181 History of falling: Secondary | ICD-10-CM | POA: Diagnosis not present

## 2018-10-05 DIAGNOSIS — Z1231 Encounter for screening mammogram for malignant neoplasm of breast: Secondary | ICD-10-CM | POA: Diagnosis not present

## 2018-10-05 DIAGNOSIS — Z853 Personal history of malignant neoplasm of breast: Secondary | ICD-10-CM | POA: Diagnosis not present

## 2018-11-07 DIAGNOSIS — N3090 Cystitis, unspecified without hematuria: Secondary | ICD-10-CM | POA: Diagnosis not present

## 2018-11-07 DIAGNOSIS — E039 Hypothyroidism, unspecified: Secondary | ICD-10-CM | POA: Diagnosis not present

## 2018-11-07 DIAGNOSIS — E042 Nontoxic multinodular goiter: Secondary | ICD-10-CM | POA: Diagnosis not present

## 2018-11-07 DIAGNOSIS — E1122 Type 2 diabetes mellitus with diabetic chronic kidney disease: Secondary | ICD-10-CM | POA: Diagnosis not present

## 2018-11-07 DIAGNOSIS — E782 Mixed hyperlipidemia: Secondary | ICD-10-CM | POA: Diagnosis not present

## 2018-11-27 DIAGNOSIS — Z1339 Encounter for screening examination for other mental health and behavioral disorders: Secondary | ICD-10-CM | POA: Diagnosis not present

## 2018-11-27 DIAGNOSIS — Z9181 History of falling: Secondary | ICD-10-CM | POA: Diagnosis not present

## 2018-11-27 DIAGNOSIS — Z0000 Encounter for general adult medical examination without abnormal findings: Secondary | ICD-10-CM | POA: Diagnosis not present

## 2018-11-27 DIAGNOSIS — Z139 Encounter for screening, unspecified: Secondary | ICD-10-CM | POA: Diagnosis not present

## 2018-11-27 DIAGNOSIS — Z136 Encounter for screening for cardiovascular disorders: Secondary | ICD-10-CM | POA: Diagnosis not present

## 2018-11-27 DIAGNOSIS — Z1331 Encounter for screening for depression: Secondary | ICD-10-CM | POA: Diagnosis not present

## 2018-11-27 DIAGNOSIS — E785 Hyperlipidemia, unspecified: Secondary | ICD-10-CM | POA: Diagnosis not present

## 2019-02-07 DIAGNOSIS — E119 Type 2 diabetes mellitus without complications: Secondary | ICD-10-CM | POA: Diagnosis not present

## 2019-02-07 DIAGNOSIS — H5213 Myopia, bilateral: Secondary | ICD-10-CM | POA: Diagnosis not present

## 2019-02-22 DIAGNOSIS — N952 Postmenopausal atrophic vaginitis: Secondary | ICD-10-CM | POA: Diagnosis not present

## 2019-02-22 DIAGNOSIS — Z01419 Encounter for gynecological examination (general) (routine) without abnormal findings: Secondary | ICD-10-CM | POA: Diagnosis not present

## 2019-02-22 DIAGNOSIS — Z130 Encounter for screening for diseases of the blood and blood-forming organs and certain disorders involving the immune mechanism: Secondary | ICD-10-CM | POA: Diagnosis not present

## 2019-02-22 DIAGNOSIS — Z13820 Encounter for screening for osteoporosis: Secondary | ICD-10-CM | POA: Diagnosis not present

## 2019-02-22 DIAGNOSIS — Z1389 Encounter for screening for other disorder: Secondary | ICD-10-CM | POA: Diagnosis not present

## 2019-05-13 IMAGING — US US THYROID
1 series · 13 of 25 positions shown · non-contrast
Comparison: 05/27/2016;

CLINICAL DATA: Hypothyroid. Multiple thyroid nodules. History fine
needle aspiration of isthmic nodule on 06/03/2014.

EXAM:
THYROID ULTRASOUND
TECHNIQUE: Ultrasound examination of the thyroid gland and adjacent soft
tissues was performed.

[Series 1: us thyroid · 13 of 62 slices shown]
[im 1/62]
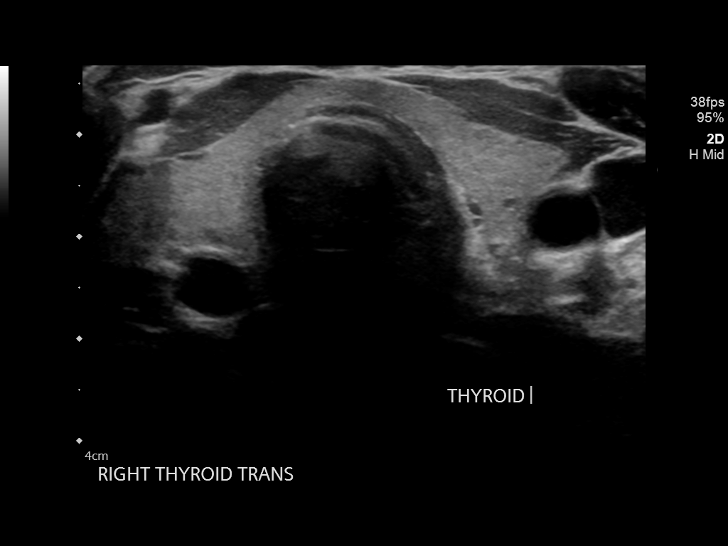
[im 6/62]
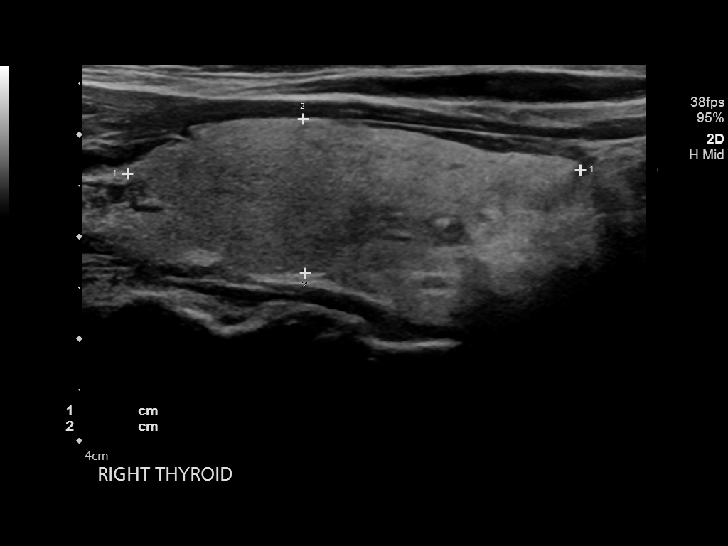
[im 11/62]
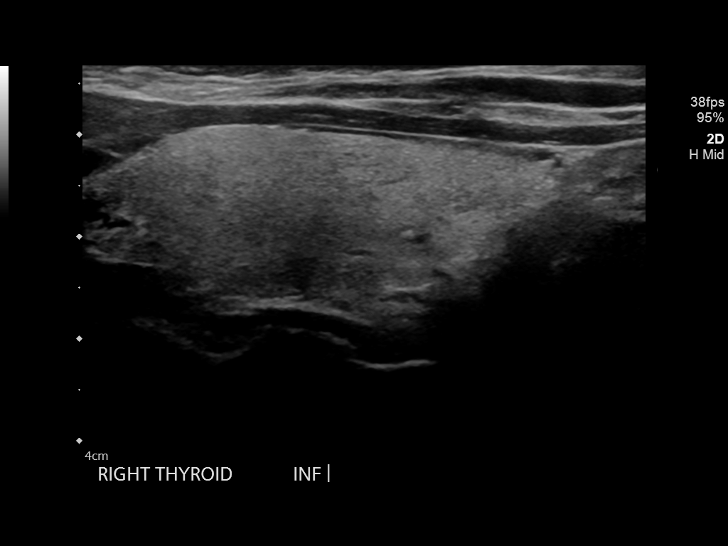
[im 16/62]
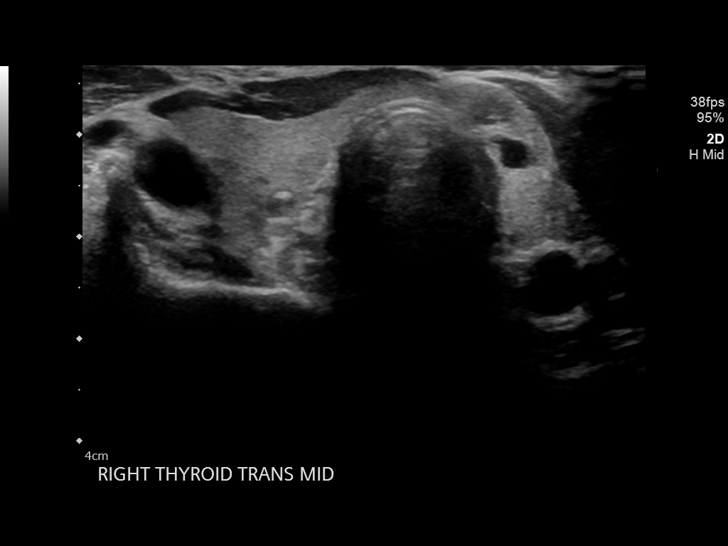
[im 21/62]
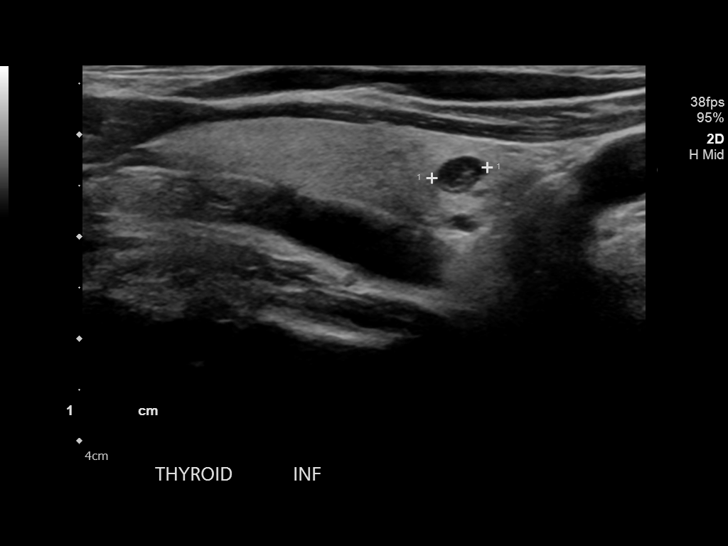
[im 26/62]
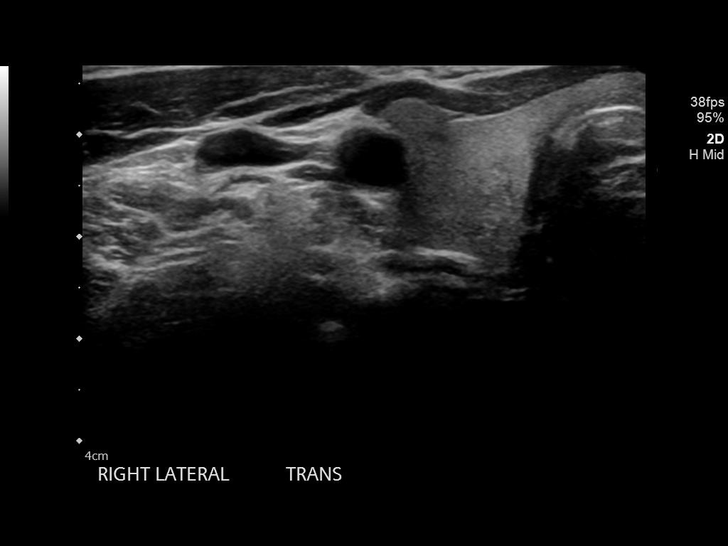
[im 31/62]
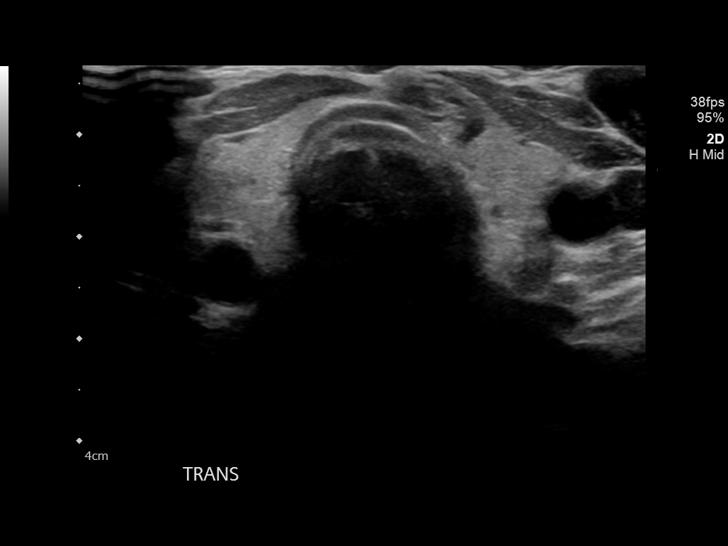
[im 36/62]
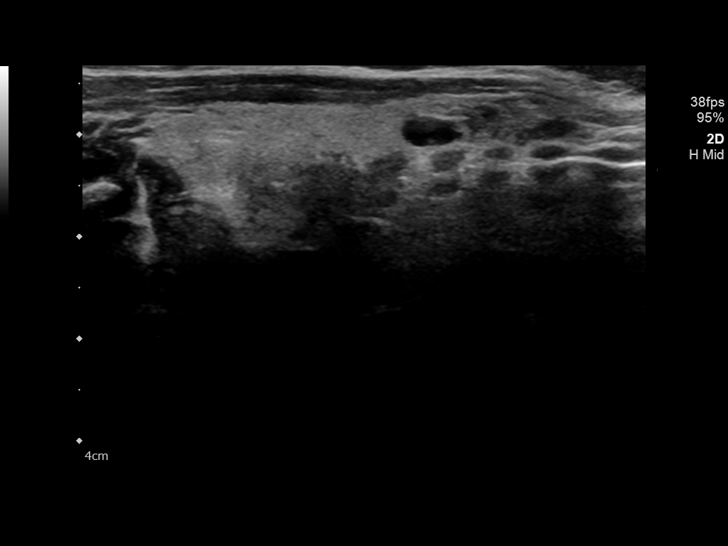
[im 41/62]
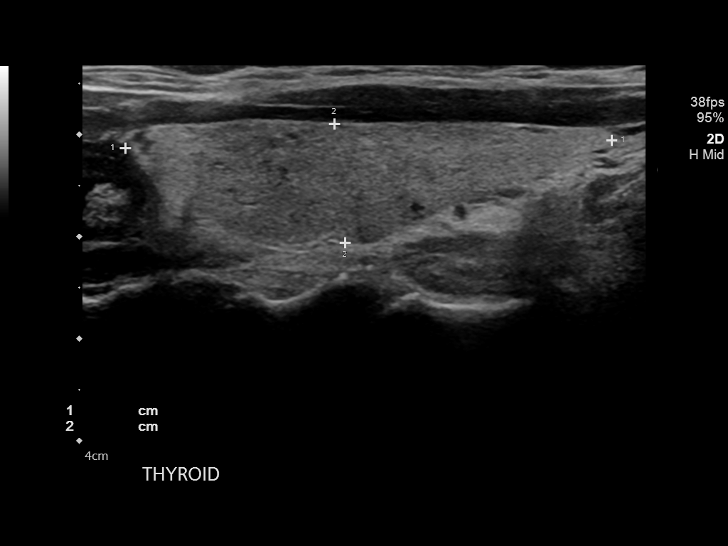
[im 46/62]
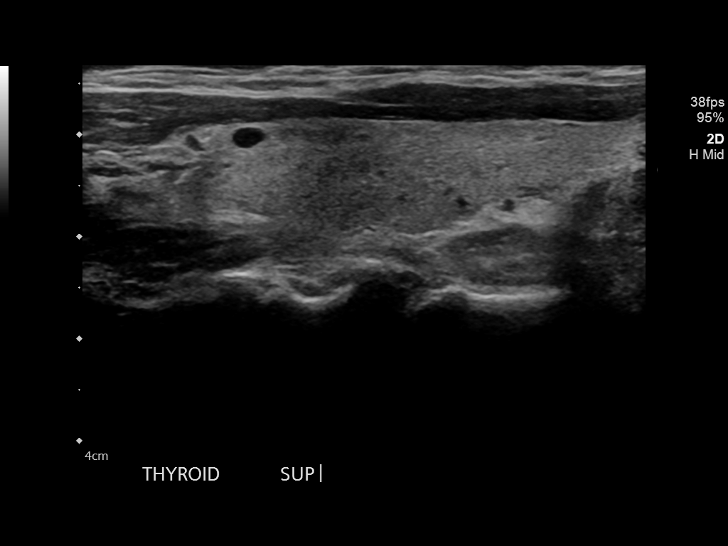
[im 51/62]
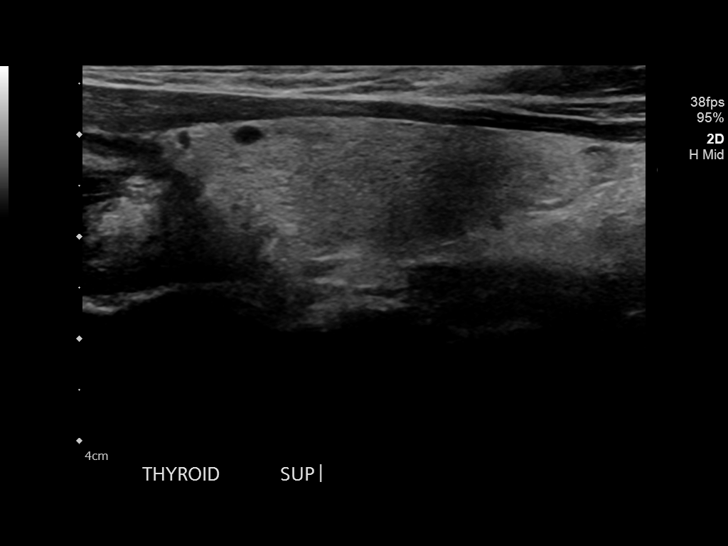
[im 56/62]
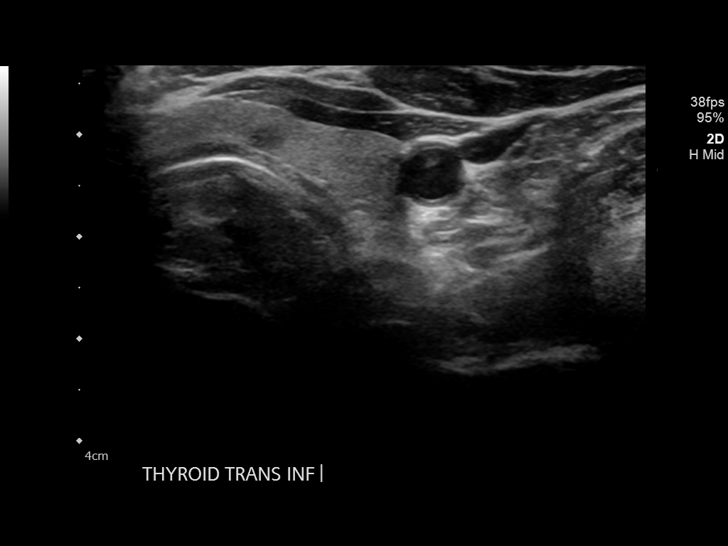
[im 62/62]
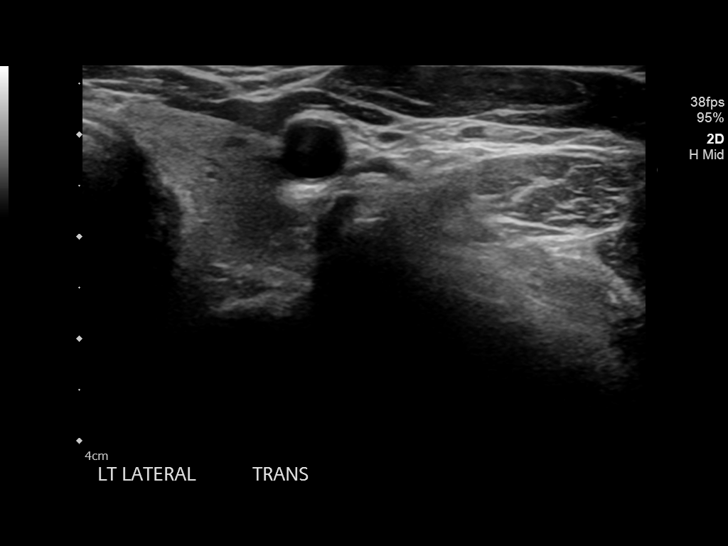

[13 of 25 positions shown; findings below may reference images not displayed]

05/25/2015; 05/16/2014; 10/08/2009;
ultrasound-guided isthmic nodule fine-needle aspiration-06/03/2014
FINDINGS: Parenchymal Echotexture: Normal

Isthmus: Normal in size measures 0.3 cm in diameter, unchanged

Right lobe: Normal in size measuring 4.4 x 1.7 x 1 5 cm, unchanged,
previously, 4.1 x 2.4 x 1.6 cm

Left lobe: Normal in size measuring 4.8 x 1.2 x 1.2 cm, unchanged,
previously, 5.3 x 1.1 x 1.6 cm

_________________________________________________________

Estimated total number of nodules >/= 1 cm: 1

Number of spongiform nodules >/=  2 cm not described below (TR1): 0

Number of mixed cystic and solid nodules >/= 1.5 cm not described
below (TR2): 0

_________________________________________________________

Punctate (approximately 0.6 cm) anechoic cyst within the inferior
pole the right lobe of the thyroid is unchanged to decreased in size
compared to the [DATE] examination, previously, 0.7 cm and again
does not meet imaging criteria to recommend percutaneous sampling or
continued dedicated follow-up.

Previously biopsied approximately 1.3 x 1.2 x 0.5 cm nodule within
the left side of the thyroid isthmus is grossly unchanged compared
to the [DATE] examination, previously, 1.8 x 1.3 x 0.7 cm when
compared to the [DATE] examination. Correlation a prior biopsy
results is recommended.

Punctate (approximately 0.6 cm) anechoic cyst within the inferior
left lateral aspect the thyroid isthmus is unchanged in hind site
compared to the [DATE] examination and again does not meet imaging
criteria to recommend percutaneous sampling or continued dedicated
follow-up.

Punctate (approximately 0.4 cm anechoic cyst within the superior
pole the left lobe of the thyroid is unchanged compared to the
[DATE] examination again does not meet imaging criteria to
recommend percutaneous sampling or continued dedicated follow-up.
IMPRESSION: 1. Similar findings of multinodular goiter. No new or enlarging
thyroid nodules.
2. Previously biopsied approximately 1.3 cm nodule within the left
side of the thyroid isthmus is unchanged compared to the [DATE]
examination. Correlation with prior biopsy results is recommended.
Assuming a benign pathologic diagnosis, repeat sampling and/or
continued dedicated follow-up is not recommended.
3. None of the remaining thyroid nodules, the majority of which
appear cystic in nature, meet imaging criteria to recommend
percutaneous sampling or continued dedicated follow-up.

The above is in keeping with the ACR TI-RADS recommendations - [HOSPITAL] 9231;[DATE].

## 2019-05-15 DIAGNOSIS — E042 Nontoxic multinodular goiter: Secondary | ICD-10-CM | POA: Diagnosis not present

## 2019-05-15 DIAGNOSIS — Z6821 Body mass index (BMI) 21.0-21.9, adult: Secondary | ICD-10-CM | POA: Diagnosis not present

## 2019-05-15 DIAGNOSIS — E782 Mixed hyperlipidemia: Secondary | ICD-10-CM | POA: Diagnosis not present

## 2019-05-15 DIAGNOSIS — E1122 Type 2 diabetes mellitus with diabetic chronic kidney disease: Secondary | ICD-10-CM | POA: Diagnosis not present

## 2019-05-15 DIAGNOSIS — E039 Hypothyroidism, unspecified: Secondary | ICD-10-CM | POA: Diagnosis not present

## 2019-05-15 DIAGNOSIS — N182 Chronic kidney disease, stage 2 (mild): Secondary | ICD-10-CM | POA: Diagnosis not present

## 2019-06-21 DIAGNOSIS — N390 Urinary tract infection, site not specified: Secondary | ICD-10-CM | POA: Diagnosis not present

## 2019-06-21 DIAGNOSIS — Z6821 Body mass index (BMI) 21.0-21.9, adult: Secondary | ICD-10-CM | POA: Diagnosis not present

## 2019-07-12 DIAGNOSIS — N390 Urinary tract infection, site not specified: Secondary | ICD-10-CM | POA: Diagnosis not present

## 2019-08-05 DIAGNOSIS — N182 Chronic kidney disease, stage 2 (mild): Secondary | ICD-10-CM | POA: Diagnosis not present

## 2019-08-05 DIAGNOSIS — E1122 Type 2 diabetes mellitus with diabetic chronic kidney disease: Secondary | ICD-10-CM | POA: Diagnosis not present

## 2019-08-05 DIAGNOSIS — R05 Cough: Secondary | ICD-10-CM | POA: Diagnosis not present

## 2019-10-04 DIAGNOSIS — M85851 Other specified disorders of bone density and structure, right thigh: Secondary | ICD-10-CM | POA: Diagnosis not present

## 2019-10-04 DIAGNOSIS — Z1231 Encounter for screening mammogram for malignant neoplasm of breast: Secondary | ICD-10-CM | POA: Diagnosis not present

## 2019-10-04 DIAGNOSIS — M85852 Other specified disorders of bone density and structure, left thigh: Secondary | ICD-10-CM | POA: Diagnosis not present

## 2019-10-28 DIAGNOSIS — N182 Chronic kidney disease, stage 2 (mild): Secondary | ICD-10-CM | POA: Diagnosis not present

## 2019-10-28 DIAGNOSIS — Z1211 Encounter for screening for malignant neoplasm of colon: Secondary | ICD-10-CM | POA: Diagnosis not present

## 2019-10-28 DIAGNOSIS — E039 Hypothyroidism, unspecified: Secondary | ICD-10-CM | POA: Diagnosis not present

## 2019-10-28 DIAGNOSIS — Z6821 Body mass index (BMI) 21.0-21.9, adult: Secondary | ICD-10-CM | POA: Diagnosis not present

## 2019-10-28 DIAGNOSIS — M8580 Other specified disorders of bone density and structure, unspecified site: Secondary | ICD-10-CM | POA: Diagnosis not present

## 2019-10-28 DIAGNOSIS — Z8616 Personal history of COVID-19: Secondary | ICD-10-CM | POA: Diagnosis not present

## 2019-10-28 DIAGNOSIS — E782 Mixed hyperlipidemia: Secondary | ICD-10-CM | POA: Diagnosis not present

## 2019-10-28 DIAGNOSIS — E042 Nontoxic multinodular goiter: Secondary | ICD-10-CM | POA: Diagnosis not present

## 2019-10-28 DIAGNOSIS — E1122 Type 2 diabetes mellitus with diabetic chronic kidney disease: Secondary | ICD-10-CM | POA: Diagnosis not present

## 2019-10-31 ENCOUNTER — Other Ambulatory Visit (HOSPITAL_COMMUNITY): Payer: Self-pay | Admitting: Family Medicine

## 2019-10-31 ENCOUNTER — Other Ambulatory Visit: Payer: Self-pay | Admitting: Family Medicine

## 2019-10-31 DIAGNOSIS — E042 Nontoxic multinodular goiter: Secondary | ICD-10-CM

## 2019-12-03 DIAGNOSIS — Z139 Encounter for screening, unspecified: Secondary | ICD-10-CM | POA: Diagnosis not present

## 2019-12-03 DIAGNOSIS — Z9181 History of falling: Secondary | ICD-10-CM | POA: Diagnosis not present

## 2019-12-03 DIAGNOSIS — Z0000 Encounter for general adult medical examination without abnormal findings: Secondary | ICD-10-CM | POA: Diagnosis not present

## 2019-12-03 DIAGNOSIS — E785 Hyperlipidemia, unspecified: Secondary | ICD-10-CM | POA: Diagnosis not present

## 2019-12-03 DIAGNOSIS — Z1331 Encounter for screening for depression: Secondary | ICD-10-CM | POA: Diagnosis not present

## 2019-12-13 ENCOUNTER — Ambulatory Visit (HOSPITAL_COMMUNITY)
Admission: RE | Admit: 2019-12-13 | Discharge: 2019-12-13 | Disposition: A | Discharge status: Home / Self Care | Payer: PPO | Source: Ambulatory Visit | Attending: Family Medicine | Admitting: Family Medicine

## 2019-12-13 ENCOUNTER — Other Ambulatory Visit: Payer: Self-pay

## 2019-12-13 DIAGNOSIS — E041 Nontoxic single thyroid nodule: Secondary | ICD-10-CM | POA: Diagnosis not present

## 2019-12-13 DIAGNOSIS — E042 Nontoxic multinodular goiter: Secondary | ICD-10-CM | POA: Diagnosis not present

## 2020-02-21 DIAGNOSIS — Z01419 Encounter for gynecological examination (general) (routine) without abnormal findings: Secondary | ICD-10-CM | POA: Diagnosis not present

## 2020-02-21 DIAGNOSIS — N763 Subacute and chronic vulvitis: Secondary | ICD-10-CM | POA: Diagnosis not present

## 2020-03-25 DIAGNOSIS — N390 Urinary tract infection, site not specified: Secondary | ICD-10-CM | POA: Diagnosis not present

## 2020-03-25 DIAGNOSIS — Z6821 Body mass index (BMI) 21.0-21.9, adult: Secondary | ICD-10-CM | POA: Diagnosis not present

## 2020-04-15 DIAGNOSIS — N390 Urinary tract infection, site not specified: Secondary | ICD-10-CM | POA: Diagnosis not present

## 2020-04-24 DIAGNOSIS — N763 Subacute and chronic vulvitis: Secondary | ICD-10-CM | POA: Diagnosis not present

## 2020-04-29 DIAGNOSIS — Z23 Encounter for immunization: Secondary | ICD-10-CM | POA: Diagnosis not present

## 2020-04-29 DIAGNOSIS — E782 Mixed hyperlipidemia: Secondary | ICD-10-CM | POA: Diagnosis not present

## 2020-04-29 DIAGNOSIS — Z6821 Body mass index (BMI) 21.0-21.9, adult: Secondary | ICD-10-CM | POA: Diagnosis not present

## 2020-04-29 DIAGNOSIS — Z853 Personal history of malignant neoplasm of breast: Secondary | ICD-10-CM | POA: Diagnosis not present

## 2020-04-29 DIAGNOSIS — E039 Hypothyroidism, unspecified: Secondary | ICD-10-CM | POA: Diagnosis not present

## 2020-04-29 DIAGNOSIS — E042 Nontoxic multinodular goiter: Secondary | ICD-10-CM | POA: Diagnosis not present

## 2020-04-29 DIAGNOSIS — N182 Chronic kidney disease, stage 2 (mild): Secondary | ICD-10-CM | POA: Diagnosis not present

## 2020-04-29 DIAGNOSIS — M8580 Other specified disorders of bone density and structure, unspecified site: Secondary | ICD-10-CM | POA: Diagnosis not present

## 2020-04-29 DIAGNOSIS — Z1211 Encounter for screening for malignant neoplasm of colon: Secondary | ICD-10-CM | POA: Diagnosis not present

## 2020-04-29 DIAGNOSIS — L84 Corns and callosities: Secondary | ICD-10-CM | POA: Diagnosis not present

## 2020-04-29 DIAGNOSIS — E1122 Type 2 diabetes mellitus with diabetic chronic kidney disease: Secondary | ICD-10-CM | POA: Diagnosis not present

## 2020-06-15 DIAGNOSIS — N9089 Other specified noninflammatory disorders of vulva and perineum: Secondary | ICD-10-CM | POA: Diagnosis not present

## 2020-06-15 DIAGNOSIS — L309 Dermatitis, unspecified: Secondary | ICD-10-CM | POA: Diagnosis not present

## 2020-09-11 DIAGNOSIS — Z1211 Encounter for screening for malignant neoplasm of colon: Secondary | ICD-10-CM | POA: Diagnosis not present

## 2020-09-11 DIAGNOSIS — N182 Chronic kidney disease, stage 2 (mild): Secondary | ICD-10-CM | POA: Diagnosis not present

## 2020-09-11 DIAGNOSIS — E1122 Type 2 diabetes mellitus with diabetic chronic kidney disease: Secondary | ICD-10-CM | POA: Diagnosis not present

## 2020-09-11 DIAGNOSIS — E039 Hypothyroidism, unspecified: Secondary | ICD-10-CM | POA: Diagnosis not present

## 2020-09-11 DIAGNOSIS — E042 Nontoxic multinodular goiter: Secondary | ICD-10-CM | POA: Diagnosis not present

## 2020-09-11 DIAGNOSIS — Z6821 Body mass index (BMI) 21.0-21.9, adult: Secondary | ICD-10-CM | POA: Diagnosis not present

## 2020-09-11 DIAGNOSIS — E782 Mixed hyperlipidemia: Secondary | ICD-10-CM | POA: Diagnosis not present

## 2020-09-11 DIAGNOSIS — M8580 Other specified disorders of bone density and structure, unspecified site: Secondary | ICD-10-CM | POA: Diagnosis not present

## 2020-10-05 DIAGNOSIS — Z1231 Encounter for screening mammogram for malignant neoplasm of breast: Secondary | ICD-10-CM | POA: Diagnosis not present

## 2020-10-07 ENCOUNTER — Encounter: Payer: Self-pay | Admitting: Gastroenterology

## 2020-12-08 DIAGNOSIS — E785 Hyperlipidemia, unspecified: Secondary | ICD-10-CM | POA: Diagnosis not present

## 2020-12-08 DIAGNOSIS — Z139 Encounter for screening, unspecified: Secondary | ICD-10-CM | POA: Diagnosis not present

## 2020-12-08 DIAGNOSIS — Z1331 Encounter for screening for depression: Secondary | ICD-10-CM | POA: Diagnosis not present

## 2020-12-08 DIAGNOSIS — Z0000 Encounter for general adult medical examination without abnormal findings: Secondary | ICD-10-CM | POA: Diagnosis not present

## 2020-12-08 DIAGNOSIS — Z9181 History of falling: Secondary | ICD-10-CM | POA: Diagnosis not present

## 2021-02-19 DIAGNOSIS — Z01419 Encounter for gynecological examination (general) (routine) without abnormal findings: Secondary | ICD-10-CM | POA: Diagnosis not present

## 2021-04-21 ENCOUNTER — Encounter: Payer: Self-pay | Admitting: Gastroenterology

## 2021-05-04 DIAGNOSIS — E1122 Type 2 diabetes mellitus with diabetic chronic kidney disease: Secondary | ICD-10-CM | POA: Diagnosis not present

## 2021-05-04 DIAGNOSIS — E042 Nontoxic multinodular goiter: Secondary | ICD-10-CM | POA: Diagnosis not present

## 2021-05-04 DIAGNOSIS — M8580 Other specified disorders of bone density and structure, unspecified site: Secondary | ICD-10-CM | POA: Diagnosis not present

## 2021-05-04 DIAGNOSIS — N182 Chronic kidney disease, stage 2 (mild): Secondary | ICD-10-CM | POA: Diagnosis not present

## 2021-05-04 DIAGNOSIS — Z23 Encounter for immunization: Secondary | ICD-10-CM | POA: Diagnosis not present

## 2021-05-04 DIAGNOSIS — Z6821 Body mass index (BMI) 21.0-21.9, adult: Secondary | ICD-10-CM | POA: Diagnosis not present

## 2021-05-04 DIAGNOSIS — E039 Hypothyroidism, unspecified: Secondary | ICD-10-CM | POA: Diagnosis not present

## 2021-05-04 DIAGNOSIS — E782 Mixed hyperlipidemia: Secondary | ICD-10-CM | POA: Diagnosis not present

## 2021-05-12 DIAGNOSIS — L300 Nummular dermatitis: Secondary | ICD-10-CM | POA: Diagnosis not present

## 2021-06-02 ENCOUNTER — Ambulatory Visit (INDEPENDENT_AMBULATORY_CARE_PROVIDER_SITE_OTHER): Payer: PPO | Admitting: Gastroenterology

## 2021-06-02 ENCOUNTER — Other Ambulatory Visit: Payer: Self-pay

## 2021-06-02 ENCOUNTER — Encounter: Payer: Self-pay | Admitting: Gastroenterology

## 2021-06-02 VITALS — BP 132/76 | HR 78 | Ht 63.0 in | Wt 122.5 lb

## 2021-06-02 DIAGNOSIS — Z1211 Encounter for screening for malignant neoplasm of colon: Secondary | ICD-10-CM | POA: Diagnosis not present

## 2021-06-02 NOTE — Progress Notes (Signed)
Chief Complaint: For colonoscopy  Referring Provider:  Renaldo Reel, PA      ASSESSMENT AND PLAN;   #1. Colorectal cancer screening    Plan: -Colon with miralax prep   Discussed risks & benefits of colonoscopy. Risks including rare perforation req laparotomy, bleeding after bx/polypectomy req blood transfusion, rarely missing neoplasms, risks of anesthesia/sedation, rare risk of damage to internal organs. Benefits outweigh the risks. Patient agrees to proceed. All the questions were answered. Pt consents to proceed. HPI:    [name] is a 70 y.o. female   With H/O DM2 (diet controlled), CKD2, H/O breast cancer 2006, thyroid cysts, vulvitis  Here for colorectal cancer screening by means of colonoscopy.  No nausea, vomiting, heartburn, regurgitation, odynophagia or dysphagia.  No significant diarrhea or constipation.  No melena or hematochezia. No unintentional weight loss. No abdominal pain.  Past GI proc: Colon 01/2003- neg 01/2008: small int hoids, otherwise Nl. Dr Lajoyce Corners 06/2015: neg. Rpt in 5 yrs  H/O PSBO 10/2016 at [facility]- treated conservatively.  Has been seen by Dr. Excell Seltzer, Dr. Lovie Macadamia and Dr. Unknown Jim.  No recurrence.  Past Medical History:  Diagnosis Date   Breast cancer [facility])    Endometriosis    s/p R oophrectomy appendectomy 1987 then in [facility]   Hyperlipidemia    Insomnia    Onychomycosis    Osteoporosis    Thyroid nodule    with hypothyroidism   Type 2 diabetes mellitus ([place])     Past Surgical History:  Procedure Laterality Date   APPENDECTOMY     BREAST SURGERY Left    total mastectomy due to breast cancer. x3 Last total mastectomy   CESAREAN SECTION     COLONOSCOPY  06/25/2015   Mild sigmoid diverticulosis. Otherwise normal colonoscopy to terminal ileum   OOPHORECTOMY     VAGINAL HYSTERECTOMY      Family History  Problem Relation Age of Onset   Colon cancer Maternal Uncle        Current Outpatient Medications  Medication Sig  Dispense Refill   aspirin 81 MG chewable tablet Chew 81 mg by mouth as directed. Take 1 tablet 2 times a week on Monday and Friday     Benzoyl Peroxide 10 % CREA Apply 1 application topically as needed.     Calcium Carbonate-Vitamin D (CALTRATE 600+D PO) Take 1 tablet by mouth 2 (two) times daily.     Multiple Vitamins-Minerals (CENTRUM PO) Take 1 tablet by mouth daily.     hypromellose (GENTEAL) 0.3 % GEL ophthalmic ointment 1 application. Apply to both eyes at night and as needed during the day     levothyroxine (SYNTHROID) 50 MCG tablet Take 50 mcg by mouth daily.     Propylene Glycol 0.6 % SOLN 1 drop 4 (four) times daily as needed. Apply to both or one eye as needed     No current facility-administered medications for this visit.    Not on File  Review of Systems:  Constitutional: Denies fever, chills, diaphoresis, appetite change and has fatigue.  HEENT: neg. Had nosebleeds-better since she has cut back on aspirin. Respiratory: Denies SOB, DOE, cough, chest tightness,  and wheezing.   Cardiovascular: Denies chest pain, palpitations and leg swelling.  Genitourinary: Denies dysuria, urgency, frequency, hematuria, flank pain and difficulty urinating.  Musculoskeletal: Denies myalgias, back pain, joint swelling, arthralgias and gait problem.  Skin: No rash.  Neurological: Denies dizziness, seizures, syncope, weakness, light-headedness, numbness and headaches.  Hematological: Denies adenopathy. Easy  bruising, personal or family bleeding history  Psychiatric/Behavioral: Has anxiety, no depression     Physical Exam:    BP 132/76   Pulse 78   Ht 5\' 3"  (1.6 m)   Wt 122 lb 8 oz (55.6 kg)   BMI 21.70 kg/m  Wt Readings from Last 3 Encounters:  06/02/21 122 lb 8 oz (55.6 kg)   Constitutional:  Well-developed, in no acute distress. Psychiatric: Normal mood and affect. Behavior is normal. HEENT: No scleral icterus. Cardiovascular: Normal rate, regular rhythm. No  edema Pulmonary/chest: Effort normal and breath sounds normal. No wheezing, rales or rhonchi. Abdominal: Soft, nondistended. Nontender. Bowel sounds active throughout. There are no masses palpable. No hepatomegaly. Rectal: Deferred Neurological: Alert and oriented to person place and time. Skin: Skin is warm and dry. No rashes noted.  Data Reviewed: I have personally reviewed following labs and imaging studies     Carmell Austria, MD 06/02/2021, 10:48 AM  Cc: Renaldo Reel, PA

## 2021-06-02 NOTE — Patient Instructions (Addendum)
If you are age 68 or older, your body mass index should be between 23-30. Your Body mass index is 21.7 kg/m. If this is out of the aforementioned range listed, please consider follow up with your Primary Care Provider.  If you are age 72 or younger, your body mass index should be between 19-25. Your Body mass index is 21.7 kg/m. If this is out of the aformentioned range listed, please consider follow up with your Primary Care Provider.   ________________________________________________________  The [facility] providers would like to encourage you to use [facility] to communicate with providers for non-urgent requests or questions.  Due to long hold times on the telephone, sending your provider a message by [facility] may be a faster and more efficient way to get a response.  Please allow 48 business hours for a response.  Please remember that this is for non-urgent requests.  _______________________________________________________  Dennis Bast have been scheduled for a colonoscopy. Please follow written instructions given to you at your visit today.  Please pick up your prep supplies at the pharmacy within the next 1-3 days. If you use inhalers (even only as needed), please bring them with you on the day of your procedure.  Please call with any questions or concerns.  Thank you,  Dr. Jackquline Denmark 12-6-

## 2021-06-22 ENCOUNTER — Telehealth: Payer: Self-pay | Admitting: Gastroenterology

## 2021-06-22 NOTE — Telephone Encounter (Signed)
Pt had questions about prep and what foods she could eat and drink prior to her procedure.  All Questions answered. Pt verbalized understanding

## 2021-06-22 NOTE — Telephone Encounter (Signed)
Inbound call from patient, states that she has some questions about her prep, if you could please advise.

## 2021-06-29 ENCOUNTER — Other Ambulatory Visit: Payer: Self-pay

## 2021-06-29 ENCOUNTER — Ambulatory Visit (AMBULATORY_SURGERY_CENTER): Payer: PPO | Admitting: Gastroenterology

## 2021-06-29 ENCOUNTER — Encounter: Payer: Self-pay | Admitting: Gastroenterology

## 2021-06-29 VITALS — BP 125/63 | HR 74 | Temp 97.8°F | Resp 13 | Ht 63.0 in | Wt 122.0 lb

## 2021-06-29 DIAGNOSIS — Z1211 Encounter for screening for malignant neoplasm of colon: Secondary | ICD-10-CM

## 2021-06-29 MED ORDER — SODIUM CHLORIDE 0.9 % IV SOLN: 500.0000 mL | Freq: Once | INTRAVENOUS | Status: DC

## 2021-06-29 NOTE — Progress Notes (Signed)
Chief Complaint: For colonoscopy  Referring Provider:  Renaldo Reel, PA      ASSESSMENT AND PLAN;   #1. Colorectal cancer screening    Plan: -Colon with miralax prep   Discussed risks & benefits of colonoscopy. Risks including rare perforation req laparotomy, bleeding after bx/polypectomy req blood transfusion, rarely missing neoplasms, risks of anesthesia/sedation, rare risk of damage to internal organs. Benefits outweigh the risks. Patient agrees to proceed. All the questions were answered. Pt consents to proceed. HPI:    [name] is a 70 y.o. female   With H/O DM2 (diet controlled), CKD2, H/O breast cancer 2006, thyroid cysts, vulvitis  Here for colorectal cancer screening by means of colonoscopy.  No nausea, vomiting, heartburn, regurgitation, odynophagia or dysphagia.  No significant diarrhea or constipation.  No melena or hematochezia. No unintentional weight loss. No abdominal pain.  Past GI proc: 06/2015: neg. Rpt in 5 yrs d/t H/O advanced polyps in past  H/O PSBO 10/2016 at [facility]- treated conservatively.  Has been seen by Dr. Excell Seltzer, Dr. Lovie Macadamia and Dr. Unknown Jim.  No recurrence.  Past Medical History:  Diagnosis Date   Breast cancer ([place])    Cataract    CKD (chronic kidney disease), stage II 11/19/2015   Endometriosis    s/p R oophrectomy appendectomy 1987 then in [facility]   Hyperlipidemia    Insomnia    Onychomycosis    Thyroid nodule    with hypothyroidism   Type 2 diabetes mellitus ([place])     Past Surgical History:  Procedure Laterality Date   APPENDECTOMY     BREAST SURGERY Left    total mastectomy due to breast cancer. x3 Last total mastectomy   CESAREAN SECTION     COLONOSCOPY  06/25/2015   Mild sigmoid diverticulosis. Otherwise normal colonoscopy to terminal ileum   OOPHORECTOMY     VAGINAL HYSTERECTOMY      Family History  Problem Relation Age of Onset   Brain cancer Mother    Heart failure Father    Osteoporosis Sister     Diabetes Brother    Colon cancer Maternal Uncle     Social History   Tobacco Use   Smoking status: Never   Smokeless tobacco: Never  Substance Use Topics   Alcohol use: Never   Drug use: Never    Current Outpatient Medications  Medication Sig Dispense Refill   Calcium Carbonate-Vitamin D (CALTRATE 600+D PO) Take 1 tablet by mouth 2 (two) times daily.     levothyroxine (SYNTHROID) 50 MCG tablet Take 50 mcg by mouth daily.     Multiple Vitamins-Minerals (CENTRUM PO) Take 1 tablet by mouth daily.     Propylene Glycol 0.6 % SOLN 1 drop 4 (four) times daily as needed. Apply to both or one eye as needed     aspirin 81 MG chewable tablet Chew 81 mg by mouth as directed. Take 1 tablet 2 times a week on Monday and Friday     Benzoyl Peroxide 10 % CREA Apply 1 application topically as needed.     hypromellose (GENTEAL) 0.3 % GEL ophthalmic ointment 1 application. Apply to both eyes at night and as needed during the day     mineral oil-hydrophilic petrolatum (AQUAPHOR) ointment Apply 1 application topically as needed for dry skin. TO RECTUM     Current Facility-Administered Medications  Medication Dose Route Frequency Provider Last Rate Last Admin   0.9 %  sodium chloride infusion  500 mL Intravenous Once Jackquline Denmark,  MD        Not on File  Review of Systems:  Constitutional: Denies fever, chills, diaphoresis, appetite change and has fatigue.  HEENT: neg. Had nosebleeds-better since she has cut back on aspirin. Respiratory: Denies SOB, DOE, cough, chest tightness,  and wheezing.   Cardiovascular: Denies chest pain, palpitations and leg swelling.  Genitourinary: Denies dysuria, urgency, frequency, hematuria, flank pain and difficulty urinating.  Musculoskeletal: Denies myalgias, back pain, joint swelling, arthralgias and gait problem.  Skin: No rash.  Neurological: Denies dizziness, seizures, syncope, weakness, light-headedness, numbness and headaches.  Hematological: Denies  adenopathy. Easy bruising, personal or family bleeding history  Psychiatric/Behavioral: Has anxiety, no depression     Physical Exam:    BP 132/67   Pulse 81   Temp 97.8 F (36.6 C)   Ht 5\' 3"  (1.6 m)   Wt 122 lb (55.3 kg)   SpO2 99%   BMI 21.61 kg/m  Wt Readings from Last 3 Encounters:  06/29/21 122 lb (55.3 kg)  06/02/21 122 lb 8 oz (55.6 kg)   Constitutional:  Well-developed, in no acute distress. Psychiatric: Normal mood and affect. Behavior is normal. HEENT: No scleral icterus. Cardiovascular: Normal rate, regular rhythm. No edema Pulmonary/chest: Effort normal and breath sounds normal. No wheezing, rales or rhonchi. Abdominal: Soft, nondistended. Nontender. Bowel sounds active throughout. There are no masses palpable. No hepatomegaly. Rectal: Deferred Neurological: Alert and oriented to person place and time. Skin: Skin is warm and dry. No rashes noted.  Data Reviewed: I have personally reviewed following labs and imaging studies     Carmell Austria, MD 06/29/2021, 2:31 PM  Cc: Renaldo Reel, PA

## 2021-06-29 NOTE — Patient Instructions (Addendum)
Handout on high fiber diet and diverticulosis given.  YOU HAD AN ENDOSCOPIC PROCEDURE TODAY AT [facility]:   Refer to the procedure report that was given to you for any specific questions about what was found during the examination.  If the procedure report does not answer your questions, please call your gastroenterologist to clarify.  If you requested that your care partner not be given the details of your procedure findings, then the procedure report has been included in a sealed envelope for you to review at your convenience later.  YOU SHOULD EXPECT: Some feelings of bloating in the abdomen. Passage of more gas than usual.  Walking can help get rid of the air that was put into your GI tract during the procedure and reduce the bloating. If you had a lower endoscopy (such as a colonoscopy or flexible sigmoidoscopy) you may notice spotting of blood in your stool or on the toilet paper. If you underwent a bowel prep for your procedure, you may not have a normal bowel movement for a few days.  Please Note:  You might notice some irritation and congestion in your nose or some drainage.  This is from the oxygen used during your procedure.  There is no need for concern and it should clear up in a day or so.  SYMPTOMS TO REPORT IMMEDIATELY:  Following lower endoscopy (colonoscopy or flexible sigmoidoscopy):  Excessive amounts of blood in the stool  Significant tenderness or worsening of abdominal pains  Swelling of the abdomen that is new, acute  Fever of 100F or higher  For urgent or emergent issues, a gastroenterologist can be reached at any hour by calling [phone]. Do not use MyChart messaging for urgent concerns.    DIET:  We do recommend a small meal at first, but then you may proceed to your regular diet.  Drink plenty of fluids but you should avoid alcoholic beverages for 24 hours.  ACTIVITY:  You should plan to take it easy for the rest of today and you should NOT  DRIVE or use heavy machinery until tomorrow (because of the sedation medicines used during the test).    FOLLOW UP: Our staff will call the number listed on your records 48-72 hours following your procedure to check on you and address any questions or concerns that you may have regarding the information given to you following your procedure. If we do not reach you, we will leave a message.  We will attempt to reach you two times.  During this call, we will ask if you have developed any symptoms of COVID 19. If you develop any symptoms (ie: fever, flu-like symptoms, shortness of breath, cough etc.) before then, please call [phone].  If you test positive for Covid 19 in the 2 weeks post procedure, please call and report this information to [place].    Repeat colonoscopy is not recommended due to current age for screening purposes.  Hence, repeat colonoscopy only if with any new problems.   Please call us at [phone] if you have not heard about the biopsies in 3 weeks.    SIGNATURES/CONFIDENTIALITY: You and/or your care partner have signed paperwork which will be entered into your electronic medical record.  These signatures attest to the fact that that the information above on your After Visit Summary has been reviewed and is understood.  Full responsibility of the confidentiality of this discharge information lies with you and/or your care-partner.

## 2021-06-29 NOTE — Progress Notes (Signed)
To pacu, VSS. Report to [email or web address] 

## 2021-06-29 NOTE — Op Note (Signed)
[name] Patient Name: [name] Procedure Date: 06/29/2021 2:31 PM MRN: [id number] Endoscopist: Jackquline Denmark , MD Age: 70 Referring MD:  Date of Birth: 1951-04-19 Gender: Female Account #: [id number] Procedure:                Colonoscopy Indications:              Screening for colorectal malignant neoplasm Medicines:                Monitored Anesthesia Care Procedure:                Pre-Anesthesia Assessment:                           - Prior to the procedure, a History and Physical                            was performed, and patient medications and                            allergies were reviewed. The patient's tolerance of                            previous anesthesia was also reviewed. The risks                            and benefits of the procedure and the sedation                            options and risks were discussed with the patient.                            All questions were answered, and informed consent                            was obtained. Prior Anticoagulants: The patient has                            taken no previous anticoagulant or antiplatelet                            agents. ASA Grade Assessment: II - A patient with                            mild systemic disease. After reviewing the risks                            and benefits, the patient was deemed in                            satisfactory condition to undergo the procedure.                           After obtaining informed consent, the colonoscope  was passed under direct vision. Throughout the                            procedure, the patient's blood pressure, pulse, and                            oxygen saturations were monitored continuously. The                            PCF-HQ190L Colonoscope was introduced through the                            anus and advanced to the 2 cm into the ileum. The                            colonoscopy was  performed without difficulty. The                            patient tolerated the procedure well. The quality                            of the bowel preparation was good. The terminal                            ileum, ileocecal valve, appendiceal orifice, and                            rectum were photographed. Scope In: 2:38:26 PM Scope Out: 2:50:13 PM Scope Withdrawal Time: 0 hours 5 minutes 46 seconds  Total Procedure Duration: 0 hours 11 minutes 47 seconds  Findings:                 A few (1-2) rare small-mouthed diverticula were                            found in the sigmoid colon.                           Non-bleeding internal hemorrhoids were found during                            retroflexion. The hemorrhoids were small and Grade                            I (internal hemorrhoids that do not prolapse).                           The terminal ileum appeared normal.                           The exam was otherwise without abnormality on                            direct and retroflexion views. Complications:  No immediate complications. Estimated Blood Loss:     Estimated blood loss: none. Impression:               - Very minimal sigmoid diverticulosis.                           - The examined portion of the ileum was normal.                           - The examination was otherwise normal on direct                            and retroflexion views.                           - No specimens collected. Recommendation:           - Patient has a contact number available for                            emergencies. The signs and symptoms of potential                            delayed complications were discussed with the                            patient. Return to normal activities tomorrow.                            Written discharge instructions were provided to the                            patient.                           - High fiber diet.                            - Continue present medications.                           - Repeat colonoscopy is not recommended due to                            current age (12 years or older) for screening                            purposes. Hence, repeat colonoscopy only if with                            any new problems.                           - Return to GI office PRN.                           - The findings and  recommendations were discussed                            with the patient's family. Jackquline Denmark, MD 06/29/2021 2:56:14 PM This report has been signed electronically.

## 2021-07-01 ENCOUNTER — Telehealth: Payer: Self-pay

## 2021-07-01 NOTE — Telephone Encounter (Signed)
  Follow up Call-  Call back number 06/29/2021  Post procedure Call Back phone  # [phone]  Permission to leave phone message Yes  Some recent data might be hidden     Patient questions:  Do you have a fever, pain , or abdominal swelling? No. Pain Score  0 *  Have you tolerated food without any problems? Yes.    Have you been able to return to your normal activities? Yes.    Do you have any questions about your discharge instructions: Diet   No. Medications  No. Follow up visit  No.  Do you have questions or concerns about your Care? No.  Actions: * If pain score is 4 or above: No action needed, pain <4.

## 2021-10-08 DIAGNOSIS — Z1231 Encounter for screening mammogram for malignant neoplasm of breast: Secondary | ICD-10-CM | POA: Diagnosis not present

## 2021-10-08 DIAGNOSIS — M85852 Other specified disorders of bone density and structure, left thigh: Secondary | ICD-10-CM | POA: Diagnosis not present

## 2021-10-08 DIAGNOSIS — M85851 Other specified disorders of bone density and structure, right thigh: Secondary | ICD-10-CM | POA: Diagnosis not present

## 2021-11-11 DIAGNOSIS — Z6821 Body mass index (BMI) 21.0-21.9, adult: Secondary | ICD-10-CM | POA: Diagnosis not present

## 2021-11-11 DIAGNOSIS — E782 Mixed hyperlipidemia: Secondary | ICD-10-CM | POA: Diagnosis not present

## 2021-11-11 DIAGNOSIS — E1122 Type 2 diabetes mellitus with diabetic chronic kidney disease: Secondary | ICD-10-CM | POA: Diagnosis not present

## 2021-11-11 DIAGNOSIS — M542 Cervicalgia: Secondary | ICD-10-CM | POA: Diagnosis not present

## 2021-11-11 DIAGNOSIS — M8580 Other specified disorders of bone density and structure, unspecified site: Secondary | ICD-10-CM | POA: Diagnosis not present

## 2021-11-11 DIAGNOSIS — N182 Chronic kidney disease, stage 2 (mild): Secondary | ICD-10-CM | POA: Diagnosis not present

## 2021-11-11 DIAGNOSIS — E039 Hypothyroidism, unspecified: Secondary | ICD-10-CM | POA: Diagnosis not present

## 2021-11-11 DIAGNOSIS — E042 Nontoxic multinodular goiter: Secondary | ICD-10-CM | POA: Diagnosis not present

## 2021-12-07 DIAGNOSIS — M542 Cervicalgia: Secondary | ICD-10-CM | POA: Diagnosis not present

## 2021-12-13 DIAGNOSIS — M542 Cervicalgia: Secondary | ICD-10-CM | POA: Diagnosis not present

## 2021-12-14 DIAGNOSIS — M542 Cervicalgia: Secondary | ICD-10-CM | POA: Diagnosis not present

## 2021-12-15 DIAGNOSIS — Z1331 Encounter for screening for depression: Secondary | ICD-10-CM | POA: Diagnosis not present

## 2021-12-15 DIAGNOSIS — E785 Hyperlipidemia, unspecified: Secondary | ICD-10-CM | POA: Diagnosis not present

## 2021-12-15 DIAGNOSIS — Z139 Encounter for screening, unspecified: Secondary | ICD-10-CM | POA: Diagnosis not present

## 2021-12-15 DIAGNOSIS — Z9181 History of falling: Secondary | ICD-10-CM | POA: Diagnosis not present

## 2021-12-15 DIAGNOSIS — Z0000 Encounter for general adult medical examination without abnormal findings: Secondary | ICD-10-CM | POA: Diagnosis not present

## 2021-12-21 DIAGNOSIS — M542 Cervicalgia: Secondary | ICD-10-CM | POA: Diagnosis not present

## 2021-12-23 DIAGNOSIS — M542 Cervicalgia: Secondary | ICD-10-CM | POA: Diagnosis not present

## 2021-12-27 DIAGNOSIS — M542 Cervicalgia: Secondary | ICD-10-CM | POA: Diagnosis not present

## 2021-12-29 DIAGNOSIS — M542 Cervicalgia: Secondary | ICD-10-CM | POA: Diagnosis not present

## 2022-01-03 DIAGNOSIS — M542 Cervicalgia: Secondary | ICD-10-CM | POA: Diagnosis not present

## 2022-01-05 DIAGNOSIS — M542 Cervicalgia: Secondary | ICD-10-CM | POA: Diagnosis not present

## 2022-01-10 DIAGNOSIS — M542 Cervicalgia: Secondary | ICD-10-CM | POA: Diagnosis not present

## 2022-01-12 DIAGNOSIS — M542 Cervicalgia: Secondary | ICD-10-CM | POA: Diagnosis not present

## 2022-01-17 DIAGNOSIS — M542 Cervicalgia: Secondary | ICD-10-CM | POA: Diagnosis not present

## 2022-01-19 DIAGNOSIS — M542 Cervicalgia: Secondary | ICD-10-CM | POA: Diagnosis not present

## 2022-01-24 DIAGNOSIS — M542 Cervicalgia: Secondary | ICD-10-CM | POA: Diagnosis not present

## 2022-01-26 DIAGNOSIS — M542 Cervicalgia: Secondary | ICD-10-CM | POA: Diagnosis not present

## 2022-02-07 DIAGNOSIS — M542 Cervicalgia: Secondary | ICD-10-CM | POA: Diagnosis not present

## 2022-02-09 DIAGNOSIS — M542 Cervicalgia: Secondary | ICD-10-CM | POA: Diagnosis not present

## 2022-02-16 DIAGNOSIS — M542 Cervicalgia: Secondary | ICD-10-CM | POA: Diagnosis not present

## 2022-03-01 DIAGNOSIS — M542 Cervicalgia: Secondary | ICD-10-CM | POA: Diagnosis not present

## 2022-03-01 DIAGNOSIS — Z6820 Body mass index (BMI) 20.0-20.9, adult: Secondary | ICD-10-CM | POA: Diagnosis not present

## 2022-03-01 DIAGNOSIS — R6889 Other general symptoms and signs: Secondary | ICD-10-CM | POA: Diagnosis not present

## 2022-03-02 DIAGNOSIS — M542 Cervicalgia: Secondary | ICD-10-CM | POA: Diagnosis not present

## 2022-03-17 DIAGNOSIS — Z01419 Encounter for gynecological examination (general) (routine) without abnormal findings: Secondary | ICD-10-CM | POA: Diagnosis not present

## 2022-03-30 DIAGNOSIS — M542 Cervicalgia: Secondary | ICD-10-CM | POA: Diagnosis not present

## 2022-04-08 DIAGNOSIS — M542 Cervicalgia: Secondary | ICD-10-CM | POA: Diagnosis not present

## 2022-04-08 DIAGNOSIS — M47812 Spondylosis without myelopathy or radiculopathy, cervical region: Secondary | ICD-10-CM | POA: Diagnosis not present

## 2022-04-12 DIAGNOSIS — E1122 Type 2 diabetes mellitus with diabetic chronic kidney disease: Secondary | ICD-10-CM | POA: Diagnosis not present

## 2022-04-12 DIAGNOSIS — E039 Hypothyroidism, unspecified: Secondary | ICD-10-CM | POA: Diagnosis not present

## 2022-04-12 DIAGNOSIS — E782 Mixed hyperlipidemia: Secondary | ICD-10-CM | POA: Diagnosis not present

## 2022-05-05 DIAGNOSIS — Z6820 Body mass index (BMI) 20.0-20.9, adult: Secondary | ICD-10-CM | POA: Diagnosis not present

## 2022-05-05 DIAGNOSIS — M8580 Other specified disorders of bone density and structure, unspecified site: Secondary | ICD-10-CM | POA: Diagnosis not present

## 2022-05-05 DIAGNOSIS — M542 Cervicalgia: Secondary | ICD-10-CM | POA: Diagnosis not present

## 2022-05-05 DIAGNOSIS — E042 Nontoxic multinodular goiter: Secondary | ICD-10-CM | POA: Diagnosis not present

## 2022-05-05 DIAGNOSIS — E782 Mixed hyperlipidemia: Secondary | ICD-10-CM | POA: Diagnosis not present

## 2022-05-05 DIAGNOSIS — N182 Chronic kidney disease, stage 2 (mild): Secondary | ICD-10-CM | POA: Diagnosis not present

## 2022-05-05 DIAGNOSIS — E039 Hypothyroidism, unspecified: Secondary | ICD-10-CM | POA: Diagnosis not present

## 2022-05-05 DIAGNOSIS — E1122 Type 2 diabetes mellitus with diabetic chronic kidney disease: Secondary | ICD-10-CM | POA: Diagnosis not present

## 2022-05-06 DIAGNOSIS — M47812 Spondylosis without myelopathy or radiculopathy, cervical region: Secondary | ICD-10-CM | POA: Diagnosis not present

## 2022-05-06 DIAGNOSIS — M5030 Other cervical disc degeneration, unspecified cervical region: Secondary | ICD-10-CM | POA: Diagnosis not present

## 2022-05-06 DIAGNOSIS — M542 Cervicalgia: Secondary | ICD-10-CM | POA: Diagnosis not present

## 2022-05-24 DIAGNOSIS — M542 Cervicalgia: Secondary | ICD-10-CM | POA: Diagnosis not present

## 2022-05-27 DIAGNOSIS — M542 Cervicalgia: Secondary | ICD-10-CM | POA: Diagnosis not present

## 2022-05-27 DIAGNOSIS — M47812 Spondylosis without myelopathy or radiculopathy, cervical region: Secondary | ICD-10-CM | POA: Diagnosis not present

## 2022-06-22 DIAGNOSIS — M47812 Spondylosis without myelopathy or radiculopathy, cervical region: Secondary | ICD-10-CM | POA: Diagnosis not present

## 2022-07-13 DIAGNOSIS — M47812 Spondylosis without myelopathy or radiculopathy, cervical region: Secondary | ICD-10-CM | POA: Diagnosis not present

## 2022-08-11 DIAGNOSIS — M47812 Spondylosis without myelopathy or radiculopathy, cervical region: Secondary | ICD-10-CM | POA: Diagnosis not present

## 2022-08-11 DIAGNOSIS — M542 Cervicalgia: Secondary | ICD-10-CM | POA: Diagnosis not present

## 2022-08-11 DIAGNOSIS — M797 Fibromyalgia: Secondary | ICD-10-CM | POA: Diagnosis not present

## 2022-09-12 DIAGNOSIS — M47812 Spondylosis without myelopathy or radiculopathy, cervical region: Secondary | ICD-10-CM | POA: Diagnosis not present

## 2022-09-12 DIAGNOSIS — M797 Fibromyalgia: Secondary | ICD-10-CM | POA: Diagnosis not present

## 2022-10-10 DIAGNOSIS — Z1231 Encounter for screening mammogram for malignant neoplasm of breast: Secondary | ICD-10-CM | POA: Diagnosis not present

## 2022-11-04 DIAGNOSIS — N182 Chronic kidney disease, stage 2 (mild): Secondary | ICD-10-CM | POA: Diagnosis not present

## 2022-11-04 DIAGNOSIS — M8580 Other specified disorders of bone density and structure, unspecified site: Secondary | ICD-10-CM | POA: Diagnosis not present

## 2022-11-04 DIAGNOSIS — Z6820 Body mass index (BMI) 20.0-20.9, adult: Secondary | ICD-10-CM | POA: Diagnosis not present

## 2022-11-04 DIAGNOSIS — E782 Mixed hyperlipidemia: Secondary | ICD-10-CM | POA: Diagnosis not present

## 2022-11-04 DIAGNOSIS — E039 Hypothyroidism, unspecified: Secondary | ICD-10-CM | POA: Diagnosis not present

## 2022-11-04 DIAGNOSIS — E042 Nontoxic multinodular goiter: Secondary | ICD-10-CM | POA: Diagnosis not present

## 2022-11-04 DIAGNOSIS — E1122 Type 2 diabetes mellitus with diabetic chronic kidney disease: Secondary | ICD-10-CM | POA: Diagnosis not present

## 2022-12-06 DIAGNOSIS — M47812 Spondylosis without myelopathy or radiculopathy, cervical region: Secondary | ICD-10-CM | POA: Diagnosis not present

## 2022-12-06 DIAGNOSIS — M797 Fibromyalgia: Secondary | ICD-10-CM | POA: Diagnosis not present

## 2022-12-06 DIAGNOSIS — M542 Cervicalgia: Secondary | ICD-10-CM | POA: Diagnosis not present

## 2022-12-22 DIAGNOSIS — Z1331 Encounter for screening for depression: Secondary | ICD-10-CM | POA: Diagnosis not present

## 2022-12-22 DIAGNOSIS — Z9181 History of falling: Secondary | ICD-10-CM | POA: Diagnosis not present

## 2023-03-28 DIAGNOSIS — M542 Cervicalgia: Secondary | ICD-10-CM | POA: Diagnosis not present

## 2023-03-28 DIAGNOSIS — M797 Fibromyalgia: Secondary | ICD-10-CM | POA: Diagnosis not present

## 2023-03-28 DIAGNOSIS — M47812 Spondylosis without myelopathy or radiculopathy, cervical region: Secondary | ICD-10-CM | POA: Diagnosis not present

## 2023-03-30 DIAGNOSIS — N952 Postmenopausal atrophic vaginitis: Secondary | ICD-10-CM | POA: Diagnosis not present

## 2023-03-30 DIAGNOSIS — Z01419 Encounter for gynecological examination (general) (routine) without abnormal findings: Secondary | ICD-10-CM | POA: Diagnosis not present

## 2023-05-11 DIAGNOSIS — Z139 Encounter for screening, unspecified: Secondary | ICD-10-CM | POA: Diagnosis not present

## 2023-05-11 DIAGNOSIS — N959 Unspecified menopausal and perimenopausal disorder: Secondary | ICD-10-CM | POA: Diagnosis not present

## 2023-05-11 DIAGNOSIS — E1122 Type 2 diabetes mellitus with diabetic chronic kidney disease: Secondary | ICD-10-CM | POA: Diagnosis not present

## 2023-05-11 DIAGNOSIS — N182 Chronic kidney disease, stage 2 (mild): Secondary | ICD-10-CM | POA: Diagnosis not present

## 2023-05-11 DIAGNOSIS — Z6820 Body mass index (BMI) 20.0-20.9, adult: Secondary | ICD-10-CM | POA: Diagnosis not present

## 2023-05-11 DIAGNOSIS — E034 Atrophy of thyroid (acquired): Secondary | ICD-10-CM | POA: Diagnosis not present

## 2023-05-12 DIAGNOSIS — E034 Atrophy of thyroid (acquired): Secondary | ICD-10-CM | POA: Diagnosis not present

## 2023-05-12 DIAGNOSIS — N182 Chronic kidney disease, stage 2 (mild): Secondary | ICD-10-CM | POA: Diagnosis not present

## 2023-05-12 DIAGNOSIS — E782 Mixed hyperlipidemia: Secondary | ICD-10-CM | POA: Diagnosis not present

## 2023-05-12 DIAGNOSIS — E1122 Type 2 diabetes mellitus with diabetic chronic kidney disease: Secondary | ICD-10-CM | POA: Diagnosis not present

## 2023-08-01 DIAGNOSIS — M47812 Spondylosis without myelopathy or radiculopathy, cervical region: Secondary | ICD-10-CM | POA: Diagnosis not present

## 2023-08-01 DIAGNOSIS — M797 Fibromyalgia: Secondary | ICD-10-CM | POA: Diagnosis not present

## 2023-08-16 DIAGNOSIS — M549 Dorsalgia, unspecified: Secondary | ICD-10-CM | POA: Diagnosis not present

## 2023-08-16 DIAGNOSIS — Z6821 Body mass index (BMI) 21.0-21.9, adult: Secondary | ICD-10-CM | POA: Diagnosis not present

## 2023-10-11 DIAGNOSIS — N958 Other specified menopausal and perimenopausal disorders: Secondary | ICD-10-CM | POA: Diagnosis not present

## 2023-10-11 DIAGNOSIS — M8588 Other specified disorders of bone density and structure, other site: Secondary | ICD-10-CM | POA: Diagnosis not present

## 2023-10-11 DIAGNOSIS — Z1231 Encounter for screening mammogram for malignant neoplasm of breast: Secondary | ICD-10-CM | POA: Diagnosis not present

## 2023-11-02 DIAGNOSIS — E034 Atrophy of thyroid (acquired): Secondary | ICD-10-CM | POA: Diagnosis not present

## 2023-11-02 DIAGNOSIS — E1122 Type 2 diabetes mellitus with diabetic chronic kidney disease: Secondary | ICD-10-CM | POA: Diagnosis not present

## 2023-11-02 DIAGNOSIS — E782 Mixed hyperlipidemia: Secondary | ICD-10-CM | POA: Diagnosis not present

## 2023-11-06 DIAGNOSIS — K1370 Unspecified lesions of oral mucosa: Secondary | ICD-10-CM | POA: Diagnosis not present

## 2023-11-09 DIAGNOSIS — E782 Mixed hyperlipidemia: Secondary | ICD-10-CM | POA: Diagnosis not present

## 2023-11-09 DIAGNOSIS — N182 Chronic kidney disease, stage 2 (mild): Secondary | ICD-10-CM | POA: Diagnosis not present

## 2023-11-09 DIAGNOSIS — Z6822 Body mass index (BMI) 22.0-22.9, adult: Secondary | ICD-10-CM | POA: Diagnosis not present

## 2023-11-09 DIAGNOSIS — L84 Corns and callosities: Secondary | ICD-10-CM | POA: Diagnosis not present

## 2023-11-09 DIAGNOSIS — E034 Atrophy of thyroid (acquired): Secondary | ICD-10-CM | POA: Diagnosis not present

## 2023-11-09 DIAGNOSIS — E1122 Type 2 diabetes mellitus with diabetic chronic kidney disease: Secondary | ICD-10-CM | POA: Diagnosis not present

## 2023-11-28 DIAGNOSIS — M47812 Spondylosis without myelopathy or radiculopathy, cervical region: Secondary | ICD-10-CM | POA: Diagnosis not present

## 2023-11-28 DIAGNOSIS — M797 Fibromyalgia: Secondary | ICD-10-CM | POA: Diagnosis not present

## 2023-11-28 DIAGNOSIS — M542 Cervicalgia: Secondary | ICD-10-CM | POA: Diagnosis not present

## 2023-12-26 DIAGNOSIS — Z9181 History of falling: Secondary | ICD-10-CM | POA: Diagnosis not present

## 2023-12-26 DIAGNOSIS — Z0000 Encounter for general adult medical examination without abnormal findings: Secondary | ICD-10-CM | POA: Diagnosis not present

## 2024-03-29 DIAGNOSIS — M797 Fibromyalgia: Secondary | ICD-10-CM | POA: Diagnosis not present

## 2024-03-29 DIAGNOSIS — M542 Cervicalgia: Secondary | ICD-10-CM | POA: Diagnosis not present

## 2024-03-29 DIAGNOSIS — M47812 Spondylosis without myelopathy or radiculopathy, cervical region: Secondary | ICD-10-CM | POA: Diagnosis not present

## 2024-04-04 DIAGNOSIS — N952 Postmenopausal atrophic vaginitis: Secondary | ICD-10-CM | POA: Diagnosis not present

## 2024-04-04 DIAGNOSIS — Z01419 Encounter for gynecological examination (general) (routine) without abnormal findings: Secondary | ICD-10-CM | POA: Diagnosis not present

## 2024-05-01 DIAGNOSIS — E034 Atrophy of thyroid (acquired): Secondary | ICD-10-CM | POA: Diagnosis not present

## 2024-05-01 DIAGNOSIS — E1122 Type 2 diabetes mellitus with diabetic chronic kidney disease: Secondary | ICD-10-CM | POA: Diagnosis not present

## 2024-05-01 DIAGNOSIS — E782 Mixed hyperlipidemia: Secondary | ICD-10-CM | POA: Diagnosis not present

## 2024-05-07 DIAGNOSIS — E1122 Type 2 diabetes mellitus with diabetic chronic kidney disease: Secondary | ICD-10-CM | POA: Diagnosis not present

## 2024-05-07 DIAGNOSIS — E782 Mixed hyperlipidemia: Secondary | ICD-10-CM | POA: Diagnosis not present

## 2024-05-07 DIAGNOSIS — E034 Atrophy of thyroid (acquired): Secondary | ICD-10-CM | POA: Diagnosis not present

## 2024-05-07 DIAGNOSIS — Z6823 Body mass index (BMI) 23.0-23.9, adult: Secondary | ICD-10-CM | POA: Diagnosis not present

## 2024-05-07 DIAGNOSIS — N182 Chronic kidney disease, stage 2 (mild): Secondary | ICD-10-CM | POA: Diagnosis not present
